# Patient Record
Sex: Female | Born: 1940 | Race: White | Hispanic: No | Smoking: Never smoker
Health system: Southern US, Community
[De-identification: ages and names within clinical notes are randomized; demographics above are authoritative.]

## PROBLEM LIST (undated history)

## (undated) DIAGNOSIS — S2249XA Multiple fractures of ribs, unspecified side, initial encounter for closed fracture: Secondary | ICD-10-CM

## (undated) DIAGNOSIS — M5090 Cervical disc disorder, unspecified, unspecified cervical region: Secondary | ICD-10-CM

## (undated) DIAGNOSIS — H0230 Blepharochalasis unspecified eye, unspecified eyelid: Secondary | ICD-10-CM

## (undated) DIAGNOSIS — Z9889 Other specified postprocedural states: Secondary | ICD-10-CM

## (undated) HISTORY — DX: Other specified postprocedural states: Z98.890

## (undated) HISTORY — DX: Cervical disc disorder, unspecified, unspecified cervical region: M50.90

## (undated) HISTORY — PX: KIDNEY STONE SURGERY: SHX686

## (undated) HISTORY — PX: OTHER SURGICAL HISTORY: SHX169

## (undated) HISTORY — DX: Blepharochalasis unspecified eye, unspecified eyelid: H02.30

## (undated) HISTORY — PX: TUBAL LIGATION: SHX77

## (undated) HISTORY — DX: Multiple fractures of ribs, unspecified side, initial encounter for closed fracture: S22.49XA

---

## 2000-09-22 ENCOUNTER — Other Ambulatory Visit: Admission: RE | Admit: 2000-09-22 | Discharge: 2000-09-22 | Payer: Self-pay | Admitting: Gynecology

## 2001-12-09 ENCOUNTER — Other Ambulatory Visit: Admission: RE | Admit: 2001-12-09 | Discharge: 2001-12-09 | Payer: Self-pay | Admitting: Gynecology

## 2003-07-01 HISTORY — PX: ROTATOR CUFF REPAIR: SHX139

## 2003-11-23 ENCOUNTER — Ambulatory Visit (HOSPITAL_BASED_OUTPATIENT_CLINIC_OR_DEPARTMENT_OTHER): Admission: RE | Admit: 2003-11-23 | Discharge: 2003-11-23 | Payer: Self-pay | Admitting: Orthopedic Surgery

## 2003-11-23 ENCOUNTER — Ambulatory Visit (HOSPITAL_COMMUNITY): Admission: RE | Admit: 2003-11-23 | Discharge: 2003-11-23 | Payer: Self-pay | Admitting: Orthopedic Surgery

## 2010-01-28 HISTORY — PX: BUNIONECTOMY: SHX129

## 2010-06-30 HISTORY — PX: CATARACT EXTRACTION: SUR2

## 2015-10-31 DIAGNOSIS — S0086XA Insect bite (nonvenomous) of other part of head, initial encounter: Secondary | ICD-10-CM | POA: Diagnosis not present

## 2015-10-31 DIAGNOSIS — L720 Epidermal cyst: Secondary | ICD-10-CM | POA: Diagnosis not present

## 2015-12-04 DIAGNOSIS — H524 Presbyopia: Secondary | ICD-10-CM | POA: Diagnosis not present

## 2015-12-04 DIAGNOSIS — H26493 Other secondary cataract, bilateral: Secondary | ICD-10-CM | POA: Diagnosis not present

## 2015-12-07 DIAGNOSIS — F419 Anxiety disorder, unspecified: Secondary | ICD-10-CM | POA: Diagnosis not present

## 2015-12-07 DIAGNOSIS — E7800 Pure hypercholesterolemia, unspecified: Secondary | ICD-10-CM | POA: Diagnosis not present

## 2015-12-13 DIAGNOSIS — L739 Follicular disorder, unspecified: Secondary | ICD-10-CM | POA: Diagnosis not present

## 2015-12-13 DIAGNOSIS — L570 Actinic keratosis: Secondary | ICD-10-CM | POA: Diagnosis not present

## 2015-12-13 DIAGNOSIS — L578 Other skin changes due to chronic exposure to nonionizing radiation: Secondary | ICD-10-CM | POA: Diagnosis not present

## 2015-12-13 DIAGNOSIS — D1801 Hemangioma of skin and subcutaneous tissue: Secondary | ICD-10-CM | POA: Diagnosis not present

## 2016-01-17 DIAGNOSIS — M50221 Other cervical disc displacement at C4-C5 level: Secondary | ICD-10-CM | POA: Diagnosis not present

## 2016-01-17 DIAGNOSIS — M25512 Pain in left shoulder: Secondary | ICD-10-CM | POA: Diagnosis not present

## 2016-01-17 DIAGNOSIS — R079 Chest pain, unspecified: Secondary | ICD-10-CM | POA: Diagnosis not present

## 2016-01-17 DIAGNOSIS — M542 Cervicalgia: Secondary | ICD-10-CM | POA: Diagnosis not present

## 2016-01-17 DIAGNOSIS — M50223 Other cervical disc displacement at C6-C7 level: Secondary | ICD-10-CM | POA: Diagnosis not present

## 2016-01-17 DIAGNOSIS — M50222 Other cervical disc displacement at C5-C6 level: Secondary | ICD-10-CM | POA: Diagnosis not present

## 2016-01-21 DIAGNOSIS — M542 Cervicalgia: Secondary | ICD-10-CM | POA: Diagnosis not present

## 2016-01-21 DIAGNOSIS — L720 Epidermal cyst: Secondary | ICD-10-CM | POA: Diagnosis not present

## 2016-02-26 DIAGNOSIS — M47812 Spondylosis without myelopathy or radiculopathy, cervical region: Secondary | ICD-10-CM | POA: Diagnosis not present

## 2016-02-26 DIAGNOSIS — M542 Cervicalgia: Secondary | ICD-10-CM | POA: Diagnosis not present

## 2016-04-16 DIAGNOSIS — Z23 Encounter for immunization: Secondary | ICD-10-CM | POA: Diagnosis not present

## 2016-04-16 DIAGNOSIS — N3000 Acute cystitis without hematuria: Secondary | ICD-10-CM | POA: Diagnosis not present

## 2016-07-24 DIAGNOSIS — M25551 Pain in right hip: Secondary | ICD-10-CM | POA: Diagnosis not present

## 2016-08-07 DIAGNOSIS — K644 Residual hemorrhoidal skin tags: Secondary | ICD-10-CM | POA: Diagnosis not present

## 2016-08-07 DIAGNOSIS — N952 Postmenopausal atrophic vaginitis: Secondary | ICD-10-CM | POA: Diagnosis not present

## 2016-10-30 DIAGNOSIS — N3000 Acute cystitis without hematuria: Secondary | ICD-10-CM | POA: Diagnosis not present

## 2016-10-30 DIAGNOSIS — N39498 Other specified urinary incontinence: Secondary | ICD-10-CM | POA: Diagnosis not present

## 2017-01-15 DIAGNOSIS — N3946 Mixed incontinence: Secondary | ICD-10-CM | POA: Diagnosis not present

## 2017-03-17 DIAGNOSIS — H2703 Aphakia, bilateral: Secondary | ICD-10-CM | POA: Diagnosis not present

## 2017-03-19 DIAGNOSIS — Z0000 Encounter for general adult medical examination without abnormal findings: Secondary | ICD-10-CM | POA: Diagnosis not present

## 2017-03-19 DIAGNOSIS — E7800 Pure hypercholesterolemia, unspecified: Secondary | ICD-10-CM | POA: Diagnosis not present

## 2017-03-19 DIAGNOSIS — Z79899 Other long term (current) drug therapy: Secondary | ICD-10-CM | POA: Diagnosis not present

## 2017-03-19 DIAGNOSIS — Z23 Encounter for immunization: Secondary | ICD-10-CM | POA: Diagnosis not present

## 2017-03-19 DIAGNOSIS — N39498 Other specified urinary incontinence: Secondary | ICD-10-CM | POA: Diagnosis not present

## 2017-03-19 DIAGNOSIS — Z6826 Body mass index (BMI) 26.0-26.9, adult: Secondary | ICD-10-CM | POA: Diagnosis not present

## 2017-04-08 DIAGNOSIS — D492 Neoplasm of unspecified behavior of bone, soft tissue, and skin: Secondary | ICD-10-CM | POA: Diagnosis not present

## 2017-04-16 DIAGNOSIS — S90862A Insect bite (nonvenomous), left foot, initial encounter: Secondary | ICD-10-CM | POA: Diagnosis not present

## 2017-04-16 DIAGNOSIS — L03032 Cellulitis of left toe: Secondary | ICD-10-CM | POA: Diagnosis not present

## 2017-04-27 DIAGNOSIS — D492 Neoplasm of unspecified behavior of bone, soft tissue, and skin: Secondary | ICD-10-CM | POA: Diagnosis not present

## 2017-04-27 DIAGNOSIS — M79672 Pain in left foot: Secondary | ICD-10-CM | POA: Diagnosis not present

## 2017-04-27 DIAGNOSIS — S62512A Displaced fracture of proximal phalanx of left thumb, initial encounter for closed fracture: Secondary | ICD-10-CM | POA: Diagnosis not present

## 2017-04-30 DIAGNOSIS — T63301A Toxic effect of unspecified spider venom, accidental (unintentional), initial encounter: Secondary | ICD-10-CM | POA: Insufficient documentation

## 2017-05-06 DIAGNOSIS — M79672 Pain in left foot: Secondary | ICD-10-CM | POA: Diagnosis not present

## 2017-05-06 DIAGNOSIS — S92502S Displaced unspecified fracture of left lesser toe(s), sequela: Secondary | ICD-10-CM | POA: Diagnosis not present

## 2017-05-07 DIAGNOSIS — S92502S Displaced unspecified fracture of left lesser toe(s), sequela: Secondary | ICD-10-CM | POA: Diagnosis not present

## 2017-05-18 DIAGNOSIS — M79672 Pain in left foot: Secondary | ICD-10-CM | POA: Diagnosis not present

## 2017-05-19 DIAGNOSIS — S92502S Displaced unspecified fracture of left lesser toe(s), sequela: Secondary | ICD-10-CM | POA: Diagnosis not present

## 2017-05-19 DIAGNOSIS — M79672 Pain in left foot: Secondary | ICD-10-CM | POA: Diagnosis not present

## 2017-05-19 DIAGNOSIS — M19072 Primary osteoarthritis, left ankle and foot: Secondary | ICD-10-CM | POA: Diagnosis not present

## 2017-05-19 DIAGNOSIS — X58XXXS Exposure to other specified factors, sequela: Secondary | ICD-10-CM | POA: Diagnosis not present

## 2017-05-20 DIAGNOSIS — R229 Localized swelling, mass and lump, unspecified: Secondary | ICD-10-CM | POA: Diagnosis not present

## 2017-05-20 DIAGNOSIS — R21 Rash and other nonspecific skin eruption: Secondary | ICD-10-CM | POA: Diagnosis not present

## 2017-05-20 DIAGNOSIS — L03115 Cellulitis of right lower limb: Secondary | ICD-10-CM | POA: Diagnosis not present

## 2017-05-20 DIAGNOSIS — T782XXA Anaphylactic shock, unspecified, initial encounter: Secondary | ICD-10-CM | POA: Diagnosis not present

## 2017-05-20 DIAGNOSIS — R079 Chest pain, unspecified: Secondary | ICD-10-CM | POA: Diagnosis not present

## 2017-05-25 DIAGNOSIS — T7840XA Allergy, unspecified, initial encounter: Secondary | ICD-10-CM | POA: Diagnosis not present

## 2017-05-25 DIAGNOSIS — L03119 Cellulitis of unspecified part of limb: Secondary | ICD-10-CM | POA: Diagnosis not present

## 2017-05-25 DIAGNOSIS — S92902D Unspecified fracture of left foot, subsequent encounter for fracture with routine healing: Secondary | ICD-10-CM | POA: Diagnosis not present

## 2017-06-03 DIAGNOSIS — M79672 Pain in left foot: Secondary | ICD-10-CM | POA: Diagnosis not present

## 2017-06-03 DIAGNOSIS — L03116 Cellulitis of left lower limb: Secondary | ICD-10-CM | POA: Diagnosis not present

## 2017-06-24 DIAGNOSIS — M79672 Pain in left foot: Secondary | ICD-10-CM | POA: Diagnosis not present

## 2017-07-23 DIAGNOSIS — M79672 Pain in left foot: Secondary | ICD-10-CM | POA: Diagnosis not present

## 2017-08-04 DIAGNOSIS — B351 Tinea unguium: Secondary | ICD-10-CM | POA: Diagnosis not present

## 2017-08-04 DIAGNOSIS — L0390 Cellulitis, unspecified: Secondary | ICD-10-CM | POA: Diagnosis not present

## 2017-09-11 DIAGNOSIS — M7061 Trochanteric bursitis, right hip: Secondary | ICD-10-CM | POA: Diagnosis not present

## 2017-09-30 DIAGNOSIS — R2689 Other abnormalities of gait and mobility: Secondary | ICD-10-CM | POA: Diagnosis not present

## 2017-09-30 DIAGNOSIS — M25551 Pain in right hip: Secondary | ICD-10-CM | POA: Diagnosis not present

## 2017-10-28 DIAGNOSIS — I8002 Phlebitis and thrombophlebitis of superficial vessels of left lower extremity: Secondary | ICD-10-CM | POA: Diagnosis not present

## 2017-10-29 DIAGNOSIS — M7061 Trochanteric bursitis, right hip: Secondary | ICD-10-CM | POA: Diagnosis not present

## 2017-10-29 DIAGNOSIS — Z6827 Body mass index (BMI) 27.0-27.9, adult: Secondary | ICD-10-CM | POA: Diagnosis not present

## 2017-11-13 DIAGNOSIS — M25551 Pain in right hip: Secondary | ICD-10-CM | POA: Diagnosis not present

## 2017-11-13 DIAGNOSIS — M7061 Trochanteric bursitis, right hip: Secondary | ICD-10-CM | POA: Diagnosis not present

## 2018-01-21 DIAGNOSIS — H0015 Chalazion left lower eyelid: Secondary | ICD-10-CM | POA: Diagnosis not present

## 2018-01-21 DIAGNOSIS — H01004 Unspecified blepharitis left upper eyelid: Secondary | ICD-10-CM | POA: Diagnosis not present

## 2018-02-15 DIAGNOSIS — M545 Low back pain: Secondary | ICD-10-CM | POA: Diagnosis not present

## 2018-02-15 DIAGNOSIS — M542 Cervicalgia: Secondary | ICD-10-CM | POA: Diagnosis not present

## 2018-02-15 DIAGNOSIS — M9903 Segmental and somatic dysfunction of lumbar region: Secondary | ICD-10-CM | POA: Diagnosis not present

## 2018-02-15 DIAGNOSIS — M9901 Segmental and somatic dysfunction of cervical region: Secondary | ICD-10-CM | POA: Diagnosis not present

## 2018-02-22 DIAGNOSIS — M9901 Segmental and somatic dysfunction of cervical region: Secondary | ICD-10-CM | POA: Diagnosis not present

## 2018-02-22 DIAGNOSIS — M545 Low back pain: Secondary | ICD-10-CM | POA: Diagnosis not present

## 2018-02-22 DIAGNOSIS — M9903 Segmental and somatic dysfunction of lumbar region: Secondary | ICD-10-CM | POA: Diagnosis not present

## 2018-02-22 DIAGNOSIS — M542 Cervicalgia: Secondary | ICD-10-CM | POA: Diagnosis not present

## 2018-02-23 DIAGNOSIS — H538 Other visual disturbances: Secondary | ICD-10-CM | POA: Diagnosis not present

## 2018-03-16 DIAGNOSIS — L570 Actinic keratosis: Secondary | ICD-10-CM | POA: Diagnosis not present

## 2018-03-16 DIAGNOSIS — L578 Other skin changes due to chronic exposure to nonionizing radiation: Secondary | ICD-10-CM | POA: Diagnosis not present

## 2018-03-22 DIAGNOSIS — H02054 Trichiasis without entropian left upper eyelid: Secondary | ICD-10-CM | POA: Diagnosis not present

## 2018-03-23 DIAGNOSIS — E663 Overweight: Secondary | ICD-10-CM | POA: Diagnosis not present

## 2018-03-23 DIAGNOSIS — K210 Gastro-esophageal reflux disease with esophagitis: Secondary | ICD-10-CM | POA: Diagnosis not present

## 2018-03-23 DIAGNOSIS — Z13820 Encounter for screening for osteoporosis: Secondary | ICD-10-CM | POA: Diagnosis not present

## 2018-03-23 DIAGNOSIS — E7800 Pure hypercholesterolemia, unspecified: Secondary | ICD-10-CM | POA: Diagnosis not present

## 2018-03-23 DIAGNOSIS — Z23 Encounter for immunization: Secondary | ICD-10-CM | POA: Diagnosis not present

## 2018-03-23 DIAGNOSIS — E559 Vitamin D deficiency, unspecified: Secondary | ICD-10-CM | POA: Diagnosis not present

## 2018-03-23 DIAGNOSIS — Z6827 Body mass index (BMI) 27.0-27.9, adult: Secondary | ICD-10-CM | POA: Diagnosis not present

## 2018-03-23 DIAGNOSIS — N959 Unspecified menopausal and perimenopausal disorder: Secondary | ICD-10-CM | POA: Diagnosis not present

## 2018-03-23 DIAGNOSIS — Z0000 Encounter for general adult medical examination without abnormal findings: Secondary | ICD-10-CM | POA: Diagnosis not present

## 2018-03-28 DIAGNOSIS — B029 Zoster without complications: Secondary | ICD-10-CM | POA: Diagnosis not present

## 2018-03-29 DIAGNOSIS — H53142 Visual discomfort, left eye: Secondary | ICD-10-CM | POA: Diagnosis not present

## 2018-03-30 DIAGNOSIS — G501 Atypical facial pain: Secondary | ICD-10-CM | POA: Diagnosis not present

## 2018-03-30 DIAGNOSIS — R51 Headache: Secondary | ICD-10-CM | POA: Diagnosis not present

## 2018-04-08 DIAGNOSIS — Z041 Encounter for examination and observation following transport accident: Secondary | ICD-10-CM | POA: Diagnosis not present

## 2018-04-08 DIAGNOSIS — M79662 Pain in left lower leg: Secondary | ICD-10-CM | POA: Diagnosis not present

## 2018-04-08 DIAGNOSIS — S2002XA Contusion of left breast, initial encounter: Secondary | ICD-10-CM | POA: Diagnosis not present

## 2018-04-08 DIAGNOSIS — Y92410 Unspecified street and highway as the place of occurrence of the external cause: Secondary | ICD-10-CM | POA: Diagnosis not present

## 2018-04-08 DIAGNOSIS — M79651 Pain in right thigh: Secondary | ICD-10-CM | POA: Diagnosis not present

## 2018-04-08 DIAGNOSIS — M542 Cervicalgia: Secondary | ICD-10-CM | POA: Diagnosis not present

## 2018-04-08 DIAGNOSIS — M79661 Pain in right lower leg: Secondary | ICD-10-CM | POA: Diagnosis not present

## 2018-04-08 DIAGNOSIS — S81812A Laceration without foreign body, left lower leg, initial encounter: Secondary | ICD-10-CM | POA: Diagnosis not present

## 2018-04-08 DIAGNOSIS — Z23 Encounter for immunization: Secondary | ICD-10-CM | POA: Diagnosis not present

## 2018-04-08 DIAGNOSIS — R109 Unspecified abdominal pain: Secondary | ICD-10-CM | POA: Diagnosis not present

## 2018-04-08 DIAGNOSIS — M545 Low back pain: Secondary | ICD-10-CM | POA: Diagnosis not present

## 2018-04-08 DIAGNOSIS — R51 Headache: Secondary | ICD-10-CM | POA: Diagnosis not present

## 2018-04-08 DIAGNOSIS — Y999 Unspecified external cause status: Secondary | ICD-10-CM | POA: Diagnosis not present

## 2018-04-08 DIAGNOSIS — S2242XA Multiple fractures of ribs, left side, initial encounter for closed fracture: Secondary | ICD-10-CM | POA: Diagnosis not present

## 2018-04-08 DIAGNOSIS — M25512 Pain in left shoulder: Secondary | ICD-10-CM | POA: Diagnosis not present

## 2018-04-08 DIAGNOSIS — S2769XA Other injury of pleura, initial encounter: Secondary | ICD-10-CM | POA: Diagnosis not present

## 2018-04-09 DIAGNOSIS — S20212A Contusion of left front wall of thorax, initial encounter: Secondary | ICD-10-CM | POA: Insufficient documentation

## 2018-04-09 DIAGNOSIS — R519 Headache, unspecified: Secondary | ICD-10-CM | POA: Insufficient documentation

## 2018-04-09 DIAGNOSIS — S81812A Laceration without foreign body, left lower leg, initial encounter: Secondary | ICD-10-CM | POA: Insufficient documentation

## 2018-04-09 DIAGNOSIS — M79651 Pain in right thigh: Secondary | ICD-10-CM | POA: Insufficient documentation

## 2018-04-09 DIAGNOSIS — R58 Hemorrhage, not elsewhere classified: Secondary | ICD-10-CM | POA: Insufficient documentation

## 2018-04-13 ENCOUNTER — Other Ambulatory Visit: Payer: Self-pay | Admitting: *Deleted

## 2018-04-13 DIAGNOSIS — S2242XD Multiple fractures of ribs, left side, subsequent encounter for fracture with routine healing: Secondary | ICD-10-CM | POA: Diagnosis not present

## 2018-04-13 DIAGNOSIS — S81812D Laceration without foreign body, left lower leg, subsequent encounter: Secondary | ICD-10-CM | POA: Diagnosis not present

## 2018-04-13 DIAGNOSIS — S20219D Contusion of unspecified front wall of thorax, subsequent encounter: Secondary | ICD-10-CM | POA: Diagnosis not present
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
## 2018-04-15 ENCOUNTER — Other Ambulatory Visit: Payer: Self-pay | Admitting: *Deleted
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
## 2018-04-20 ENCOUNTER — Ambulatory Visit: Payer: Self-pay | Admitting: *Deleted

## 2018-04-20 DIAGNOSIS — S20212D Contusion of left front wall of thorax, subsequent encounter: Secondary | ICD-10-CM | POA: Diagnosis not present

## 2018-04-20 DIAGNOSIS — S2242XA Multiple fractures of ribs, left side, initial encounter for closed fracture: Secondary | ICD-10-CM | POA: Diagnosis not present

## 2018-04-20 DIAGNOSIS — S20212A Contusion of left front wall of thorax, initial encounter: Secondary | ICD-10-CM | POA: Diagnosis not present

## 2018-04-20 DIAGNOSIS — S81812A Laceration without foreign body, left lower leg, initial encounter: Secondary | ICD-10-CM | POA: Diagnosis not present

## 2018-04-20 DIAGNOSIS — Z79899 Other long term (current) drug therapy: Secondary | ICD-10-CM | POA: Diagnosis not present

## 2018-04-20 DIAGNOSIS — M542 Cervicalgia: Secondary | ICD-10-CM | POA: Diagnosis not present

## 2018-04-20 DIAGNOSIS — S81812D Laceration without foreign body, left lower leg, subsequent encounter: Secondary | ICD-10-CM | POA: Diagnosis not present

## 2018-04-20 DIAGNOSIS — S2242XD Multiple fractures of ribs, left side, subsequent encounter for fracture with routine healing: Secondary | ICD-10-CM | POA: Diagnosis not present

## 2018-04-22 ENCOUNTER — Other Ambulatory Visit: Payer: Self-pay | Admitting: *Deleted
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
## 2018-04-26 DIAGNOSIS — S20219D Contusion of unspecified front wall of thorax, subsequent encounter: Secondary | ICD-10-CM | POA: Diagnosis not present

## 2018-04-26 DIAGNOSIS — S2242XD Multiple fractures of ribs, left side, subsequent encounter for fracture with routine healing: Secondary | ICD-10-CM | POA: Diagnosis not present

## 2018-04-26 DIAGNOSIS — S81812D Laceration without foreign body, left lower leg, subsequent encounter: Secondary | ICD-10-CM | POA: Diagnosis not present

## 2018-04-29 DIAGNOSIS — S81812D Laceration without foreign body, left lower leg, subsequent encounter: Secondary | ICD-10-CM | POA: Diagnosis not present

## 2018-04-29 DIAGNOSIS — T148XXA Other injury of unspecified body region, initial encounter: Secondary | ICD-10-CM | POA: Diagnosis not present

## 2018-04-29 DIAGNOSIS — S2242XD Multiple fractures of ribs, left side, subsequent encounter for fracture with routine healing: Secondary | ICD-10-CM | POA: Diagnosis not present

## 2018-05-03 DIAGNOSIS — S060X9A Concussion with loss of consciousness of unspecified duration, initial encounter: Secondary | ICD-10-CM | POA: Diagnosis not present

## 2018-05-03 DIAGNOSIS — S81812A Laceration without foreign body, left lower leg, initial encounter: Secondary | ICD-10-CM | POA: Diagnosis not present

## 2018-05-03 DIAGNOSIS — S2242XA Multiple fractures of ribs, left side, initial encounter for closed fracture: Secondary | ICD-10-CM | POA: Diagnosis not present

## 2018-05-12 DIAGNOSIS — S81812A Laceration without foreign body, left lower leg, initial encounter: Secondary | ICD-10-CM | POA: Diagnosis not present

## 2018-05-20 DIAGNOSIS — M542 Cervicalgia: Secondary | ICD-10-CM | POA: Diagnosis not present

## 2018-06-03 DIAGNOSIS — M542 Cervicalgia: Secondary | ICD-10-CM | POA: Diagnosis not present

## 2018-06-03 DIAGNOSIS — M79602 Pain in left arm: Secondary | ICD-10-CM | POA: Diagnosis not present

## 2018-06-03 DIAGNOSIS — M25512 Pain in left shoulder: Secondary | ICD-10-CM | POA: Diagnosis not present

## 2018-06-08 DIAGNOSIS — M25512 Pain in left shoulder: Secondary | ICD-10-CM | POA: Diagnosis not present

## 2018-06-08 DIAGNOSIS — M542 Cervicalgia: Secondary | ICD-10-CM | POA: Diagnosis not present

## 2018-06-08 DIAGNOSIS — M79602 Pain in left arm: Secondary | ICD-10-CM | POA: Diagnosis not present

## 2018-06-11 DIAGNOSIS — M542 Cervicalgia: Secondary | ICD-10-CM | POA: Diagnosis not present

## 2018-06-11 DIAGNOSIS — M79602 Pain in left arm: Secondary | ICD-10-CM | POA: Diagnosis not present

## 2018-06-11 DIAGNOSIS — M25512 Pain in left shoulder: Secondary | ICD-10-CM | POA: Diagnosis not present

## 2018-06-16 DIAGNOSIS — M542 Cervicalgia: Secondary | ICD-10-CM | POA: Diagnosis not present

## 2018-06-16 DIAGNOSIS — M79602 Pain in left arm: Secondary | ICD-10-CM | POA: Diagnosis not present

## 2018-06-16 DIAGNOSIS — M25512 Pain in left shoulder: Secondary | ICD-10-CM | POA: Diagnosis not present

## 2018-08-25 DIAGNOSIS — L821 Other seborrheic keratosis: Secondary | ICD-10-CM | POA: Diagnosis not present

## 2018-08-25 DIAGNOSIS — L820 Inflamed seborrheic keratosis: Secondary | ICD-10-CM | POA: Diagnosis not present

## 2018-08-25 DIAGNOSIS — L578 Other skin changes due to chronic exposure to nonionizing radiation: Secondary | ICD-10-CM | POA: Diagnosis not present

## 2018-08-25 DIAGNOSIS — L570 Actinic keratosis: Secondary | ICD-10-CM | POA: Diagnosis not present

## 2018-09-08 DIAGNOSIS — M79605 Pain in left leg: Secondary | ICD-10-CM | POA: Diagnosis not present

## 2018-10-05 DIAGNOSIS — D485 Neoplasm of uncertain behavior of skin: Secondary | ICD-10-CM | POA: Diagnosis not present

## 2018-10-25 DIAGNOSIS — H0015 Chalazion left lower eyelid: Secondary | ICD-10-CM | POA: Diagnosis not present

## 2018-10-28 ENCOUNTER — Other Ambulatory Visit: Payer: Self-pay | Admitting: *Deleted

## 2018-10-28 NOTE — Patient Outreach (Signed)
[facility]) Care Management  10/28/2018  Rebecca Pittman [birth year] [id number]   Late entry note for 04/26/18 case closure   Case closure   Call attempts made x 3 Unsuccessful outreach letter sent without a response   Plan closed case after no response from patient within 10 business days. Unable to reach  [name]. Lavina Hamman, RN, BSN, [place] Coordinator Office number [phone] Mobile number [phone]  Main [facility] number [phone] Fax number [phone]

## 2018-11-19 DIAGNOSIS — M79662 Pain in left lower leg: Secondary | ICD-10-CM | POA: Diagnosis not present

## 2018-11-19 DIAGNOSIS — R600 Localized edema: Secondary | ICD-10-CM | POA: Diagnosis not present

## 2018-11-20 DIAGNOSIS — L03116 Cellulitis of left lower limb: Secondary | ICD-10-CM | POA: Diagnosis not present

## 2018-11-20 DIAGNOSIS — M79662 Pain in left lower leg: Secondary | ICD-10-CM | POA: Diagnosis not present

## 2018-11-26 DIAGNOSIS — M7989 Other specified soft tissue disorders: Secondary | ICD-10-CM | POA: Diagnosis not present

## 2018-11-26 DIAGNOSIS — M25572 Pain in left ankle and joints of left foot: Secondary | ICD-10-CM | POA: Diagnosis not present

## 2018-11-26 DIAGNOSIS — G8929 Other chronic pain: Secondary | ICD-10-CM | POA: Diagnosis not present

## 2018-11-29 DIAGNOSIS — M25472 Effusion, left ankle: Secondary | ICD-10-CM | POA: Insufficient documentation

## 2018-12-03 DIAGNOSIS — L03116 Cellulitis of left lower limb: Secondary | ICD-10-CM | POA: Diagnosis not present

## 2018-12-07 DIAGNOSIS — L03116 Cellulitis of left lower limb: Secondary | ICD-10-CM | POA: Insufficient documentation

## 2018-12-24 DIAGNOSIS — M25572 Pain in left ankle and joints of left foot: Secondary | ICD-10-CM | POA: Diagnosis not present

## 2018-12-24 DIAGNOSIS — M25472 Effusion, left ankle: Secondary | ICD-10-CM | POA: Diagnosis not present

## 2019-01-06 DIAGNOSIS — M25572 Pain in left ankle and joints of left foot: Secondary | ICD-10-CM | POA: Diagnosis not present

## 2019-01-06 DIAGNOSIS — M7989 Other specified soft tissue disorders: Secondary | ICD-10-CM | POA: Diagnosis not present

## 2019-01-06 DIAGNOSIS — M25472 Effusion, left ankle: Secondary | ICD-10-CM | POA: Diagnosis not present

## 2019-01-12 DIAGNOSIS — M7989 Other specified soft tissue disorders: Secondary | ICD-10-CM | POA: Diagnosis not present

## 2019-01-12 DIAGNOSIS — S63509A Unspecified sprain of unspecified wrist, initial encounter: Secondary | ICD-10-CM | POA: Diagnosis not present

## 2019-01-13 DIAGNOSIS — M7989 Other specified soft tissue disorders: Secondary | ICD-10-CM | POA: Insufficient documentation

## 2019-01-13 DIAGNOSIS — S63509A Unspecified sprain of unspecified wrist, initial encounter: Secondary | ICD-10-CM | POA: Insufficient documentation

## 2019-02-07 DIAGNOSIS — M79605 Pain in left leg: Secondary | ICD-10-CM | POA: Diagnosis not present

## 2019-03-01 DIAGNOSIS — M7989 Other specified soft tissue disorders: Secondary | ICD-10-CM | POA: Diagnosis not present

## 2019-03-01 DIAGNOSIS — L03116 Cellulitis of left lower limb: Secondary | ICD-10-CM | POA: Diagnosis not present

## 2019-03-04 DIAGNOSIS — M79605 Pain in left leg: Secondary | ICD-10-CM | POA: Diagnosis not present

## 2019-03-21 DIAGNOSIS — Z6828 Body mass index (BMI) 28.0-28.9, adult: Secondary | ICD-10-CM | POA: Diagnosis not present

## 2019-03-21 DIAGNOSIS — E663 Overweight: Secondary | ICD-10-CM | POA: Diagnosis not present

## 2019-03-21 DIAGNOSIS — M79605 Pain in left leg: Secondary | ICD-10-CM | POA: Diagnosis not present

## 2019-03-28 DIAGNOSIS — E663 Overweight: Secondary | ICD-10-CM | POA: Diagnosis not present

## 2019-03-28 DIAGNOSIS — Z0000 Encounter for general adult medical examination without abnormal findings: Secondary | ICD-10-CM | POA: Diagnosis not present

## 2019-03-28 DIAGNOSIS — Z6828 Body mass index (BMI) 28.0-28.9, adult: Secondary | ICD-10-CM | POA: Diagnosis not present

## 2019-03-28 DIAGNOSIS — K210 Gastro-esophageal reflux disease with esophagitis: Secondary | ICD-10-CM | POA: Diagnosis not present

## 2019-03-28 DIAGNOSIS — E7800 Pure hypercholesterolemia, unspecified: Secondary | ICD-10-CM | POA: Diagnosis not present

## 2019-04-14 DIAGNOSIS — M8589 Other specified disorders of bone density and structure, multiple sites: Secondary | ICD-10-CM | POA: Diagnosis not present

## 2019-04-14 DIAGNOSIS — Z1231 Encounter for screening mammogram for malignant neoplasm of breast: Secondary | ICD-10-CM | POA: Diagnosis not present

## 2019-04-20 DIAGNOSIS — L03119 Cellulitis of unspecified part of limb: Secondary | ICD-10-CM | POA: Diagnosis not present

## 2019-05-03 DIAGNOSIS — M79605 Pain in left leg: Secondary | ICD-10-CM | POA: Diagnosis not present

## 2019-05-23 DIAGNOSIS — N6012 Diffuse cystic mastopathy of left breast: Secondary | ICD-10-CM | POA: Diagnosis not present

## 2019-05-23 DIAGNOSIS — R928 Other abnormal and inconclusive findings on diagnostic imaging of breast: Secondary | ICD-10-CM | POA: Diagnosis not present

## 2019-05-24 DIAGNOSIS — N3000 Acute cystitis without hematuria: Secondary | ICD-10-CM | POA: Diagnosis not present

## 2019-06-15 DIAGNOSIS — M79605 Pain in left leg: Secondary | ICD-10-CM | POA: Diagnosis not present

## 2019-07-07 DIAGNOSIS — L821 Other seborrheic keratosis: Secondary | ICD-10-CM | POA: Diagnosis not present

## 2019-07-07 DIAGNOSIS — L578 Other skin changes due to chronic exposure to nonionizing radiation: Secondary | ICD-10-CM | POA: Diagnosis not present

## 2019-07-14 DIAGNOSIS — R079 Chest pain, unspecified: Secondary | ICD-10-CM | POA: Diagnosis not present

## 2019-07-14 DIAGNOSIS — R5383 Other fatigue: Secondary | ICD-10-CM | POA: Diagnosis not present

## 2019-07-14 DIAGNOSIS — E649 Sequelae of unspecified nutritional deficiency: Secondary | ICD-10-CM | POA: Diagnosis not present

## 2019-07-14 DIAGNOSIS — E559 Vitamin D deficiency, unspecified: Secondary | ICD-10-CM | POA: Diagnosis not present

## 2019-07-14 DIAGNOSIS — R5381 Other malaise: Secondary | ICD-10-CM | POA: Diagnosis not present

## 2019-07-14 DIAGNOSIS — R4182 Altered mental status, unspecified: Secondary | ICD-10-CM | POA: Diagnosis not present

## 2019-07-14 DIAGNOSIS — Z6828 Body mass index (BMI) 28.0-28.9, adult: Secondary | ICD-10-CM | POA: Diagnosis not present

## 2019-07-14 DIAGNOSIS — L959 Vasculitis limited to the skin, unspecified: Secondary | ICD-10-CM | POA: Diagnosis not present

## 2019-07-26 DIAGNOSIS — L959 Vasculitis limited to the skin, unspecified: Secondary | ICD-10-CM | POA: Diagnosis not present
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
## 2019-08-03 ENCOUNTER — Encounter: Payer: Self-pay | Admitting: Cardiology

## 2019-08-03 ENCOUNTER — Ambulatory Visit (INDEPENDENT_AMBULATORY_CARE_PROVIDER_SITE_OTHER): Payer: Medicare HMO | Admitting: Cardiology

## 2019-08-03 ENCOUNTER — Other Ambulatory Visit: Payer: Self-pay

## 2019-08-03 VITALS — BP 110/74 | HR 89 | Ht 62.0 in | Wt 144.4 lb

## 2019-08-03 DIAGNOSIS — R079 Chest pain, unspecified: Secondary | ICD-10-CM

## 2019-08-03 DIAGNOSIS — E785 Hyperlipidemia, unspecified: Secondary | ICD-10-CM | POA: Diagnosis not present

## 2019-08-03 MED ORDER — PRAVASTATIN SODIUM 20 MG PO TABS: 20.0000 mg | ORAL_TABLET | Freq: Every evening | ORAL | 3 refills | Status: DC

## 2019-08-03 MED ORDER — METOPROLOL SUCCINATE ER 25 MG PO TB24: 25.0000 mg | ORAL_TABLET | Freq: Every day | ORAL | 4 refills | Status: DC

## 2019-08-03 MED ORDER — NITROGLYCERIN 0.4 MG SL SUBL: 0.4000 mg | SUBLINGUAL_TABLET | SUBLINGUAL | 3 refills | Status: DC | PRN

## 2019-08-03 MED ORDER — ASPIRIN EC 81 MG PO TBEC: 81.0000 mg | DELAYED_RELEASE_TABLET | Freq: Every day | ORAL | 3 refills | Status: DC
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
## 2019-08-03 NOTE — Patient Instructions (Addendum)
Medication Instructions:  Your physician has recommended you make the following change in your medication:   START taking aspirin 81 mg (1 tablet) once daily  START taking Toprol 25mg  (1 tablet) once daily  START taking nitroglycerin as needed for chest pain, When having chest pain, stop what you are doing and sit down. Take 1 nitro, wait 5 minutes. Still having chest pain, take 1 nitro, wait 5 minutes. Still having chest pain, take 1 nitro, dial 911. Total of 3 nitro in 15 minutes.  START taking pravastatin 20 mg (1 tablet) once daily  If you need a refill on your cardiac medications before your next appointment, please call your pharmacy.   Lab work: NONE If you have labs (blood work) drawn today and your tests are completely normal, you will receive your results only by: Marland Kitchen MyChart Message (if you have MyChart) OR . A paper copy in the mail If you have any lab test that is abnormal or we need to change your treatment, we will call you to review the results.  Testing/Procedures: You had an EKG performed today.  Your physician has requested that you have an echocardiogram. Echocardiography is a painless test that uses sound waves to create images of your heart. It provides your doctor with information about the size and shape of your heart and how well your heart's chambers and valves are working. This procedure takes approximately one hour. There are no restrictions for this procedure.  Your physician has requested that you have a lexiscan myoview. For further information please visit [email or web address]. Please follow instruction sheet, as given.    Follow-Up: At [facility], you and your health needs are our priority.  As part of our continuing mission to provide you with exceptional heart care, we have created designated Provider Care Teams.  These Care Teams include your primary Cardiologist (physician) and Advanced Practice Providers (APPs -  Physician Assistants and Nurse  Practitioners) who all work together to provide you with the care you need, when you need it. You will need a follow up appointment in 3 weeks  Any Other Special Instructions Will Be Listed Below  Regadenoson injection What is this medicine? REGADENOSON is used to test the heart for coronary artery disease. It is used in patients who can not exercise for their stress test. This medicine may be used for other purposes; ask your health care provider or pharmacist if you have questions. COMMON BRAND NAME(S): Lexiscan What should I tell my health care provider before I take this medicine? They need to know if you have any of these conditions:  heart problems  lung or breathing disease, like asthma or COPD  an unusual or allergic reaction to regadenoson, other medicines, foods, dyes, or preservatives  pregnant or trying to get pregnant  breast-feeding How should I use this medicine? This medicine is for injection into a vein. It is given by a health care professional in a hospital or clinic setting. Talk to your pediatrician regarding the use of this medicine in children. Special care may be needed. Overdosage: If you think you have taken too much of this medicine contact a poison control center or emergency room at once. NOTE: This medicine is only for you. Do not share this medicine with others. What if I miss a dose? This does not apply. What may interact with this medicine?  caffeine  dipyridamole  guarana  theophylline This list may not describe all possible interactions. Give your health care provider a list  of all the medicines, herbs, non-prescription drugs, or dietary supplements you use. Also tell them if you smoke, drink alcohol, or use illegal drugs. Some items may interact with your medicine. What should I watch for while using this medicine? Your condition will be monitored carefully while you are receiving this medicine. Do not take medicines, foods, or drinks with  caffeine (like coffee, tea, or colas) for at least 12 hours before your test. If you do not know if something contains caffeine, ask your health care professional. What side effects may I notice from receiving this medicine? Side effects that you should report to your doctor or health care professional as soon as possible:  allergic reactions like skin rash, itching or hives, swelling of the face, lips, or tongue  breathing problems  chest pain, tightness or palpitations  severe headache Side effects that usually do not require medical attention (report to your doctor or health care professional if they continue or are bothersome):  flushing  headache  irritation or pain at site where injected  nausea, vomiting This list may not describe all possible side effects. Call your doctor for medical advice about side effects. You may report side effects to FDA at 1-800-FDA-1088. Where should I keep my medicine? This drug is given in a hospital or clinic and will not be stored at home. NOTE: This sheet is a summary. It may not cover all possible information. If you have questions about this medicine, talk to your doctor, pharmacist, or health care provider.  2020 Elsevier/Gold Standard (2008-02-14 15:08:13)  Cardiac Nuclear Scan A cardiac nuclear scan is a test that is done to check the flow of blood to your heart. It is done when you are resting and when you are exercising. The test looks for problems such as:  Not enough blood reaching a portion of the heart.  The heart muscle not working as it should. You may need this test if:  You have heart disease.  You have had lab results that are not normal.  You have had heart surgery or a balloon procedure to open up blocked arteries (angioplasty).  You have chest pain.  You have shortness of breath. In this test, a special dye (tracer) is put into your bloodstream. The tracer will travel to your heart. A camera will then take pictures  of your heart to see how the tracer moves through your heart. This test is usually done at a hospital and takes 2-4 hours. Tell a doctor about:  Any allergies you have.  All medicines you are taking, including vitamins, herbs, eye drops, creams, and over-the-counter medicines.  Any problems you or family members have had with anesthetic medicines.  Any blood disorders you have.  Any surgeries you have had.  Any medical conditions you have.  Whether you are pregnant or may be pregnant. What are the risks? Generally, this is a safe test. However, problems may occur, such as:  Serious chest pain and heart attack. This is only a risk if the stress portion of the test is done.  Rapid heartbeat.  A feeling of warmth in your chest. This feeling usually does not last long.  Allergic reaction to the tracer. What happens before the test?  Ask your doctor about changing or stopping your normal medicines. This is important.  Follow instructions from your doctor about what you cannot eat or drink.  Remove your jewelry on the day of the test. What happens during the test?  An IV tube  will be inserted into one of your veins.  Your doctor will give you a small amount of tracer through the IV tube.  You will wait for 20-40 minutes while the tracer moves through your bloodstream.  Your heart will be monitored with an electrocardiogram (ECG).  You will lie down on an exam table.  Pictures of your heart will be taken for about 15-20 minutes.  You may also have a stress test. For this test, one of these things may be done: ? You will be asked to exercise on a treadmill or a stationary bike. ? You will be given medicines that will make your heart work harder. This is done if you are unable to exercise.  When blood flow to your heart has peaked, a tracer will again be given through the IV tube.  After 20-40 minutes, you will get back on the exam table. More pictures will be taken of  your heart.  Depending on the tracer that is used, more pictures may need to be taken 3-4 hours later.  Your IV tube will be removed when the test is over. The test may vary among doctors and hospitals. What happens after the test?  Ask your doctor: ? Whether you can return to your normal schedule, including diet, activities, and medicines. ? Whether you should drink more fluids. This will help to remove the tracer from your body. Drink enough fluid to keep your pee (urine) pale yellow.  Ask your doctor, or the department that is doing the test: ? When will my results be ready? ? How will I get my results? Summary  A cardiac nuclear scan is a test that is done to check the flow of blood to your heart.  Tell your doctor whether you are pregnant or may be pregnant.  Before the test, ask your doctor about changing or stopping your normal medicines. This is important.  Ask your doctor whether you can return to your normal activities. You may be asked to drink more fluids. This information is not intended to replace advice given to you by your health care provider. Make sure you discuss any questions you have with your health care provider. Document Released: 11/30/2017 Document Revised: 10/06/2018 Document Reviewed: 11/30/2017 Elsevier Patient Education  [employer].  Echocardiogram An echocardiogram is a procedure that uses painless sound waves (ultrasound) to produce an image of the heart. Images from an echocardiogram can provide important information about:  Signs of coronary artery disease (CAD).  Aneurysm detection. An aneurysm is a weak or damaged part of an artery wall that bulges out from the normal force of blood pumping through the body.  Heart size and shape. Changes in the size or shape of the heart can be associated with certain conditions, including heart failure, aneurysm, and CAD.  Heart muscle function.  Heart valve function.  Signs of a past heart  attack.  Fluid buildup around the heart.  Thickening of the heart muscle.  A tumor or infectious growth around the heart valves. Tell a health care provider about:  Any allergies you have.  All medicines you are taking, including vitamins, herbs, eye drops, creams, and over-the-counter medicines.  Any blood disorders you have.  Any surgeries you have had.  Any medical conditions you have.  Whether you are pregnant or may be pregnant. What are the risks? Generally, this is a safe procedure. However, problems may occur, including:  Allergic reaction to dye (contrast) that may be used during the procedure.  What happens before the procedure? No specific preparation is needed. You may eat and drink normally. What happens during the procedure?   An IV tube may be inserted into one of your veins.  You may receive contrast through this tube. A contrast is an injection that improves the quality of the pictures from your heart.  A gel will be applied to your chest.  A wand-like tool (transducer) will be moved over your chest. The gel will help to transmit the sound waves from the transducer.  The sound waves will harmlessly bounce off of your heart to allow the heart images to be captured in real-time motion. The images will be recorded on a computer. The procedure may vary among health care providers and hospitals. What happens after the procedure?  You may return to your normal, everyday life, including diet, activities, and medicines, unless your health care provider tells you not to do that. Summary  An echocardiogram is a procedure that uses painless sound waves (ultrasound) to produce an image of the heart.  Images from an echocardiogram can provide important information about the size and shape of your heart, heart muscle function, heart valve function, and fluid buildup around your heart.  You do not need to do anything to prepare before this procedure. You may eat and  drink normally.  After the echocardiogram is completed, you may return to your normal, everyday life, unless your health care provider tells you not to do that. This information is not intended to replace advice given to you by your health care provider. Make sure you discuss any questions you have with your health care provider. Document Released: 06/13/2000 Document Revised: 10/07/2018 Document Reviewed: 07/19/2016 Elsevier Patient Education  2020 [employer].

## 2019-08-05 DIAGNOSIS — M109 Gout, unspecified: Secondary | ICD-10-CM | POA: Diagnosis not present

## 2019-08-09 ENCOUNTER — Telehealth (HOSPITAL_COMMUNITY): Payer: Self-pay

## 2019-08-09 NOTE — Telephone Encounter (Signed)

## 2019-08-10 ENCOUNTER — Encounter: Payer: Self-pay | Admitting: Vascular Surgery

## 2019-08-10 ENCOUNTER — Telehealth (HOSPITAL_COMMUNITY): Payer: Self-pay | Admitting: *Deleted

## 2019-08-10 ENCOUNTER — Ambulatory Visit: Payer: Medicare HMO | Admitting: Vascular Surgery

## 2019-08-10 ENCOUNTER — Other Ambulatory Visit: Payer: Self-pay

## 2019-08-10 ENCOUNTER — Ambulatory Visit (HOSPITAL_COMMUNITY)
Admission: RE | Admit: 2019-08-10 | Discharge: 2019-08-10 | Disposition: A | Discharge status: Home / Self Care | Payer: Medicare HMO | Source: Ambulatory Visit | Attending: Surgery | Admitting: Surgery

## 2019-08-10 VITALS — BP 109/65 | HR 88 | Temp 97.5°F | Resp 18 | Ht 61.0 in | Wt 143.9 lb

## 2019-08-10 DIAGNOSIS — I739 Peripheral vascular disease, unspecified: Secondary | ICD-10-CM | POA: Insufficient documentation

## 2019-08-10 DIAGNOSIS — M79605 Pain in left leg: Secondary | ICD-10-CM

## 2019-08-10 NOTE — Telephone Encounter (Signed)
Patient given detailed instructions per Myocardial Perfusion Study Information Sheet for the test on 08/17/2019 at 0745. Patient notified to arrive 15 minutes early and that it is imperative to arrive on time for appointment to keep from having the test rescheduled.  If you need to cancel or reschedule your appointment, please call the office within 24 hours of your appointment. . Patient verbalized understanding.Kindell Strada, Ranae Palms No mychart available
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
## 2019-08-11 ENCOUNTER — Other Ambulatory Visit: Payer: Self-pay

## 2019-08-11 ENCOUNTER — Ambulatory Visit: Payer: Medicare HMO | Admitting: Infectious Diseases

## 2019-08-11 ENCOUNTER — Other Ambulatory Visit: Payer: Self-pay | Admitting: Infectious Diseases

## 2019-08-11 ENCOUNTER — Encounter: Payer: Self-pay | Admitting: Infectious Diseases

## 2019-08-11 VITALS — BP 133/83 | HR 73 | Wt 146.0 lb

## 2019-08-11 DIAGNOSIS — L03116 Cellulitis of left lower limb: Secondary | ICD-10-CM

## 2019-08-11 DIAGNOSIS — M86472 Chronic osteomyelitis with draining sinus, left ankle and foot: Secondary | ICD-10-CM | POA: Diagnosis not present

## 2019-08-11 NOTE — Addendum Note (Signed)
Addended by: Latrease Kunde C on: 08/11/2019 03:11 PM   Modules accepted: Orders
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
## 2019-08-11 NOTE — Assessment & Plan Note (Signed)
Will repeat her MRI.  Hold on restarting her anbx at this point.  I am concerned that there is underlying infection here- there is tenderness, erythema.  I would query that she has deep infection or atypical infection (mycobacteria?) with the persistence despite repeated courses of anbx.  Will see her back after MRI, I cautioned her she may need ortho eval for bx.   Gout would be another possibility however I would not suspect repeated episodes so frequently.

## 2019-08-12 ENCOUNTER — Telehealth: Payer: Self-pay

## 2019-08-12 NOTE — Telephone Encounter (Signed)
Received call today from [place] at [facility] regarding orders for MRI with Contrast. Judson Roch states that per pt reported breathing issues when she has mri done 2 years ago.  [facility] would like to know if Md would like for pt to have MRI done at hospital. Or if new order is needed. [phone] ext:[phone] [address] Rud, [place]

## 2019-08-15 ENCOUNTER — Telehealth: Payer: Self-pay

## 2019-08-15 ENCOUNTER — Telehealth: Payer: Self-pay | Admitting: Cardiology

## 2019-08-15 NOTE — Telephone Encounter (Signed)
New Message  Patient is calling in to check with Dr. Bettina Pittman about doing the stress test on 08/17/19. Patient states that she has been referred to a infectious disease doctor for possible infection in her foot. Patient would like to know if she should do the stress test or wait. Please call back to advise.

## 2019-08-15 NOTE — Telephone Encounter (Signed)
Patient called office today with concerns for Md. States cardiologist is recommending stress test,but patient is not sure if this would be a good idea. Is concerned that her infection will effect the test. Is also concerned about ankle pain and swelling. Would like to know if MD would suggest she keep current appt for stress test 2/18 or push back appointment.  [place]

## 2019-08-15 NOTE — Telephone Encounter (Signed)
Please do not delay stress test thanks

## 2019-08-15 NOTE — Telephone Encounter (Signed)
Please do at hospital.   I left VM on the radiology number listed.

## 2019-08-15 NOTE — Telephone Encounter (Signed)
Spoke with patient. Patient has been referred to infectious disease for her foot. Informed patient that should not affect her stress test as it is not a treadmill test. Patient verbalized understanding.

## 2019-08-16 NOTE — Telephone Encounter (Signed)
Patient called to make office aware that she has canceled her stress test after being advised not to do so.  Patient states she felt like she really needed her MRI prior to stress test.    Patient states she she is concerned with increased swelling going up her leg.  Then patient says she does not want to go to the hospital for something that has been ongoing for months.   Patient is currently awaiting prior auth for MRI.    Routing to [name] to make aware.

## 2019-08-17 ENCOUNTER — Other Ambulatory Visit (HOSPITAL_COMMUNITY): Payer: Medicare HMO

## 2019-08-17 ENCOUNTER — Ambulatory Visit (HOSPITAL_COMMUNITY): Payer: Medicare HMO

## 2019-08-17 ENCOUNTER — Other Ambulatory Visit: Payer: Self-pay | Admitting: Infectious Diseases

## 2019-08-17 NOTE — Addendum Note (Signed)
Addended by: Landis Gandy on: 08/17/2019 12:11 PM   Modules accepted: Orders

## 2019-08-22 ENCOUNTER — Emergency Department (HOSPITAL_COMMUNITY)
Admission: EM | Admit: 2019-08-22 | Discharge: 2019-08-22 | Disposition: A | Discharge status: Home / Self Care | Payer: Medicare HMO | Attending: Emergency Medicine | Admitting: Emergency Medicine

## 2019-08-22 ENCOUNTER — Emergency Department (HOSPITAL_COMMUNITY): Payer: Medicare HMO

## 2019-08-22 DIAGNOSIS — M25572 Pain in left ankle and joints of left foot: Secondary | ICD-10-CM | POA: Diagnosis present

## 2019-08-22 DIAGNOSIS — Z79899 Other long term (current) drug therapy: Secondary | ICD-10-CM | POA: Insufficient documentation

## 2019-08-22 DIAGNOSIS — S91002A Unspecified open wound, left ankle, initial encounter: Secondary | ICD-10-CM | POA: Diagnosis not present

## 2019-08-22 DIAGNOSIS — L539 Erythematous condition, unspecified: Secondary | ICD-10-CM | POA: Insufficient documentation

## 2019-08-22 DIAGNOSIS — L03116 Cellulitis of left lower limb: Secondary | ICD-10-CM | POA: Diagnosis not present

## 2019-08-22 DIAGNOSIS — Z4800 Encounter for change or removal of nonsurgical wound dressing: Secondary | ICD-10-CM | POA: Insufficient documentation

## 2019-08-22 DIAGNOSIS — S91302A Unspecified open wound, left foot, initial encounter: Secondary | ICD-10-CM | POA: Diagnosis not present

## 2019-08-22 LAB — CBC WITH DIFFERENTIAL/PLATELET
Abs Immature Granulocytes: 0.05 10*3/uL (ref 0.00–0.07)
Basophils Absolute: 0.1 10*3/uL (ref 0.0–0.1)
Basophils Relative: 1 %
Eosinophils Absolute: 0.1 10*3/uL (ref 0.0–0.5)
Eosinophils Relative: 2 %
HCT: 38.1 % (ref 36.0–46.0)
Hemoglobin: 12.5 g/dL (ref 12.0–15.0)
Immature Granulocytes: 1 %
Lymphocytes Relative: 21 %
Lymphs Abs: 1.4 10*3/uL (ref 0.7–4.0)
MCH: 31 pg (ref 26.0–34.0)
MCHC: 32.8 g/dL (ref 30.0–36.0)
MCV: 94.5 fL (ref 80.0–100.0)
Monocytes Absolute: 0.6 10*3/uL (ref 0.1–1.0)
Monocytes Relative: 10 %
Neutro Abs: 4.4 10*3/uL (ref 1.7–7.7)
Neutrophils Relative %: 65 %
Platelets: 285 10*3/uL (ref 150–400)
RBC: 4.03 MIL/uL (ref 3.87–5.11)
RDW: 12.3 % (ref 11.5–15.5)
WBC: 6.6 10*3/uL (ref 4.0–10.5)
nRBC: 0 % (ref 0.0–0.2)

## 2019-08-22 LAB — BASIC METABOLIC PANEL
Anion gap: 10 (ref 5–15)
BUN: 13 mg/dL (ref 8–23)
CO2: 27 mmol/L (ref 22–32)
Calcium: 8.9 mg/dL (ref 8.9–10.3)
Chloride: 105 mmol/L (ref 98–111)
Creatinine, Ser: 0.83 mg/dL (ref 0.44–1.00)
GFR calc Af Amer: >60 mL/min (ref >60)
GFR calc non Af Amer: >60 mL/min (ref >60)
Glucose, Bld: 97 mg/dL (ref 70–99)
Potassium: 4 mmol/L (ref 3.5–5.1)
Sodium: 142 mmol/L (ref 135–145)

## 2019-08-22 MED ORDER — CEPHALEXIN 500 MG PO CAPS: 500.0000 mg | ORAL_CAPSULE | Freq: Two times a day (BID) | ORAL | 0 refills | Status: DC

## 2019-08-22 MED ORDER — GADOBUTROL 1 MMOL/ML IV SOLN
7.0000 mL | Freq: Once | INTRAVENOUS | Status: AC | PRN
  Administered 2019-08-22: 7 mL via INTRAVENOUS

## 2019-08-22 NOTE — Progress Notes (Deleted)
Cardiology Office Note:    Date:  08/22/2019   ID:  Rebecca Pittman, DOB 20-Nov-1940, MRN [id number]  PCP:  Greig Right, MD  Cardiologist:  Shirlee More, MD    Referring MD: Greig Right, MD    ASSESSMENT:    No diagnosis found. PLAN:    In order of problems listed above:  1. ***   Next appointment: ***   Medication Adjustments/Labs and Tests Ordered: Current medicines are reviewed at length with the patient today.  Concerns regarding medicines are outlined above.  No orders of the defined types were placed in this encounter.  No orders of the defined types were placed in this encounter.   No chief complaint on file.   History of Present Illness:    Rebecca Pittman is a 79 y.o. female with a hx of chest pain last seen 08/02/2018. Compliance with diet, lifestyle and medications: *** Past Medical History:  Diagnosis Date  . Cervical disc disease   . H/O eye surgery   . H/O foot surgery   . Multiple rib fractures   . Pseudoptosis     Past Surgical History:  Procedure Laterality Date  . blepharoplasty upper 2013    . BUNIONECTOMY  01/2010  . CATARACT EXTRACTION Bilateral 2012  . KIDNEY STONE SURGERY    . ROTATOR CUFF REPAIR  2005  . TUBAL LIGATION      Current Medications: No outpatient medications have been marked as taking for the 08/23/19 encounter (Appointment) with Richardo Priest, MD.     Allergies:   Contrast media [iodinated diagnostic agents], Diphenhydramine hcl, Other, and Sulfamethoxazole-trimethoprim   Social History   Socioeconomic History  . Marital status: Widowed    Spouse name: Not on file  . Number of children: Not on file  . Years of education: Not on file  . Highest education level: Not on file  Occupational History  . Not on file  Tobacco Use  . Smoking status: Never Smoker  . Smokeless tobacco: Never Used  Substance and Sexual Activity  . Alcohol use: Never  . Drug use: Never  . Sexual activity: Not Currently  Other  Topics Concern  . Not on file  Social History Narrative  . Not on file   Social Determinants of Health   Financial Resource Strain:   . Difficulty of Paying Living Expenses: Not on file  Food Insecurity:   . Worried About Charity fundraiser in the Last Year: Not on file  . Ran Out of Food in the Last Year: Not on file  Transportation Needs:   . Lack of Transportation (Medical): Not on file  . Lack of Transportation (Non-Medical): Not on file  Physical Activity:   . Days of Exercise per Week: Not on file  . Minutes of Exercise per Session: Not on file  Stress:   . Feeling of Stress : Not on file  Social Connections:   . Frequency of Communication with Friends and Family: Not on file  . Frequency of Social Gatherings with Friends and Family: Not on file  . Attends Religious Services: Not on file  . Active Member of Clubs or Organizations: Not on file  . Attends Archivist Meetings: Not on file  . Marital Status: Not on file     Family History: The patient's ***family history includes Atrial fibrillation in her sister; Colon cancer in her mother; Hyperlipidemia in her brother; Hypertension in her brother, father, and sister. ROS:   Please see  the history of present illness.    All other systems reviewed and are negative.  EKGs/Labs/Other Studies Reviewed:    The following studies were reviewed today:  EKG:  EKG ordered today and personally reviewed.  The ekg ordered today demonstrates ***  Recent Labs: 08/22/2019: BUN 13; Creatinine, Ser 0.83; Hemoglobin 12.5; Platelets 285; Potassium 4.0; Sodium 142  Recent Lipid Panel No results found for: CHOL, TRIG, HDL, CHOLHDL, VLDL, LDLCALC, LDLDIRECT  Physical Exam:    VS:  LMP  (LMP Unknown)     Wt Readings from Last 3 Encounters:  08/22/19 146 lb (66.2 kg)  08/11/19 146 lb (66.2 kg)  08/10/19 143 lb 14.4 oz (65.3 kg)     GEN: *** Well nourished, well developed in no acute distress HEENT: Normal NECK: No JVD;  No carotid bruits LYMPHATICS: No lymphadenopathy CARDIAC: ***RRR, no murmurs, rubs, gallops RESPIRATORY:  Clear to auscultation without rales, wheezing or rhonchi  ABDOMEN: Soft, non-tender, non-distended MUSCULOSKELETAL:  No edema; No deformity  SKIN: Warm and dry NEUROLOGIC:  Alert and oriented x 3 PSYCHIATRIC:  Normal affect    Signed, Shirlee More, MD  08/22/2019 1:51 PM    [facility]

## 2019-08-22 NOTE — ED Triage Notes (Signed)
Pt reports left ankle nonhealing wound for the last year. Pt reports wound worsened Saturday night, area with reddness, warmth. Saw [name] who considered underlying infection. Denies recent fevers. Pt ambulatory. NAD at present.

## 2019-08-22 NOTE — Discharge Instructions (Addendum)
The MRI of your left ankle showed cellulitis. I have spoken to [name] who recommended we start antibiotics for you today.  I have written a prescription for keflex, please take 1 tablet twice a day for the next 7 days. Follow up with him in office.

## 2019-08-22 NOTE — ED Notes (Signed)
Patient verbalizes understanding of discharge instructions. Opportunity for questioning and answers were provided. Armband removed by staff, pt discharged from ED.  
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
## 2019-08-23 ENCOUNTER — Ambulatory Visit: Payer: Medicare HMO | Admitting: Cardiology

## 2019-08-23 NOTE — Telephone Encounter (Signed)
No message needed °

## 2019-08-25 ENCOUNTER — Ambulatory Visit: Payer: Medicare HMO | Admitting: Vascular Surgery

## 2019-08-29 ENCOUNTER — Telehealth: Payer: Self-pay

## 2019-08-29 NOTE — Telephone Encounter (Signed)
COVID-19 Pre-Screening Questions:08/29/19   Do you currently have a fever (>100 F), chills or unexplained body aches? NO   Are you currently experiencing new cough, shortness of breath, sore throat, runny nose?NO  .  Have you recently travelled outside the state of [place] in the last 14 days? NO  .  Have you been in contact with someone that is currently pending confirmation of Covid19 testing or has been confirmed to have the Covid19 virus?  NO  **If the patient answers NO to ALL questions -  advise the patient to please call the clinic before coming to the office should any symptoms develop.     

## 2019-08-30 ENCOUNTER — Encounter: Payer: Self-pay | Admitting: Infectious Diseases

## 2019-08-30 ENCOUNTER — Ambulatory Visit: Payer: Medicare HMO | Admitting: Infectious Diseases

## 2019-08-30 ENCOUNTER — Other Ambulatory Visit: Payer: Self-pay

## 2019-08-30 DIAGNOSIS — L03116 Cellulitis of left lower limb: Secondary | ICD-10-CM | POA: Diagnosis not present

## 2019-08-30 MED ORDER — CEPHALEXIN 500 MG PO CAPS: 500.0000 mg | ORAL_CAPSULE | Freq: Two times a day (BID) | ORAL | 0 refills | Status: AC

## 2019-08-30 NOTE — Assessment & Plan Note (Addendum)
Her MRI was unrevealing for deep infection.  Will give her oritavancin, see her in 2 weeks.  May need skin bx? Tissue Cx.? We discussed this plan and she in concurrence.
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
## 2019-08-31 ENCOUNTER — Telehealth: Payer: Self-pay | Admitting: *Deleted

## 2019-08-31 NOTE — Telephone Encounter (Signed)
Stop keflex after she gets infusion Thanks!

## 2019-08-31 NOTE — Telephone Encounter (Signed)
Infusion scheduled for 3/9 at 11 with [facility]. Written orders faxed to [place]. Left voicemail on patients home phone with appointment information and clarification on on IV medication. Requested patient call office back with any questions. Rebecca Pittman

## 2019-08-31 NOTE — Telephone Encounter (Signed)
Patient called with some concerns about the IV infusion after looking it up last night. RN was able to answer many of her questions, reassure her that we are here at any time should she have additional concerns.  She would like to proceed with the infusion as planned. She would specifically like to know about the timing of oral antibiotics/iv infusion. Is this to be scheduled once she completes the cephalexin, or will she continue (or stop) the keflex once the infusion is scheduled? Please advise. Landis Gandy, RN

## 2019-09-01 ENCOUNTER — Telehealth (HOSPITAL_COMMUNITY): Payer: Self-pay | Admitting: Cardiology

## 2019-09-01 NOTE — Telephone Encounter (Signed)
I called patient to schedule Echocardiogram and patient declined to schedule to issues with her foot.

## 2019-09-02 NOTE — Telephone Encounter (Signed)
x2 attempt to call patient regarding appointment for infusion. Patient did not pick up call on either number listed.  [place]

## 2019-09-02 NOTE — Telephone Encounter (Signed)
Spoke with patient, confirmed infusion appointment details. Landis Gandy, RN

## 2019-09-05 ENCOUNTER — Other Ambulatory Visit (HOSPITAL_COMMUNITY): Payer: Self-pay | Admitting: *Deleted

## 2019-09-06 ENCOUNTER — Other Ambulatory Visit: Payer: Self-pay

## 2019-09-06 ENCOUNTER — Ambulatory Visit (HOSPITAL_COMMUNITY)
Admission: RE | Admit: 2019-09-06 | Discharge: 2019-09-06 | Disposition: A | Discharge status: Home / Self Care | Payer: Medicare HMO | Source: Ambulatory Visit | Attending: Infectious Diseases | Admitting: Infectious Diseases

## 2019-09-06 DIAGNOSIS — L03116 Cellulitis of left lower limb: Secondary | ICD-10-CM | POA: Insufficient documentation

## 2019-09-06 MED ORDER — ORITAVANCIN DIPHOSPHATE 400 MG IV SOLR
1200.0000 mg | Freq: Once | INTRAVENOUS | Status: AC
  Administered 2019-09-06: 11:00:00 1200 mg via INTRAVENOUS
  Filled 2019-09-06: qty 120

## 2019-09-06 NOTE — Discharge Instructions (Signed)
Oritavancin injection °What is this medicine? °ORITAVANCIN (ORE ita van sin) is an antibiotic. It is used to treat certain kinds of bacterial infections. It will not work for colds, flu, or other viral infections. °This medicine may be used for other purposes; ask your health care provider or pharmacist if you have questions. °COMMON BRAND NAME(S): ORBACTIV °What should I tell my health care provider before I take this medicine? °They need to know if you have any of these conditions: °· intestine problems, like colitis °· take medicines that treat or prevent blood clots °· an unusual or allergic reaction to oritavancin, other medicines, foods, dyes, or preservatives °· pregnant or trying to get pregnant °· breast-feeding °How should I use this medicine? °This medicine is for infusion into a vein. It is usually given by a health care professional in a hospital or clinic setting. °If you get this medicine at home, you will be taught how to prepare and give this medicine. Use exactly as directed. Take your medicine at regular intervals. Do not take your medicine more often than directed. °It is important that you put your used needles and syringes in a special sharps container. Do not put them in a trash can. If you do not have a sharps container, call your pharmacist or healthcare provider to get one. °Talk to your pediatrician regarding the use of this medicine in children. Special care may be needed. °Overdosage: If you think you have taken too much of this medicine contact a poison control center or emergency room at once. °NOTE: This medicine is only for you. Do not share this medicine with others. °What if I miss a dose? °It is important not to miss your dose. Call your doctor or health care professional if you are unable to keep an appointment. If you give yourself the medicine and you miss a dose, take it as soon as you can. If it is almost time for your next dose, take only that dose. Do not take double or  extra doses. °What may interact with this medicine? °Do not take this medicine with any of the following medications: °· heparin (infused into a vein) °This medicine may also interact with the following medications: °· birth control pills °· dextromethorphan °· midazolam °· omeprazole °· warfarin °This list may not describe all possible interactions. Give your health care provider a list of all the medicines, herbs, non-prescription drugs, or dietary supplements you use. Also tell them if you smoke, drink alcohol, or use illegal drugs. Some items may interact with your medicine. °What should I watch for while using this medicine? °Tell your doctor or healthcare professional if your symptoms do not start to get better or if they get worse. °Do not treat diarrhea with over the counter products. Contact your doctor if you have diarrhea that lasts more than 2 days or if it is severe and watery. °What side effects may I notice from receiving this medicine? °Side effects that you should report to your doctor or health care professional as soon as possible: °· allergic reactions like skin rash, itching or hives, swelling of the face, lips, or tongue °· bone pain °· bloody or watery diarrhea °· fast, irregular heartbeat °Side effects that usually do not require medical attention (report to your doctor or health care professional if they continue or are bothersome): °· diarrhea °· dizziness °· headache °· nausea, vomiting °· pain, redness, or irritation at site where injected °This list may not describe all possible side effects.   Call your doctor for medical advice about side effects. You may report side effects to FDA at 1-800-FDA-1088. °Where should I keep my medicine? °Keep out of the reach of children. °This drug is usually given in a hospital or clinic and will not be stored at home. °In rare cases, this medicine may be given at home. If you are using this medicine at home, you will be instructed on how to store this  medicine. Throw away any unused medicine after the expiration date on the label. °NOTE: This sheet is a summary. It may not cover all possible information. If you have questions about this medicine, talk to your doctor, pharmacist, or health care provider. °© 2020 Elsevier/Gold Standard (2015-07-19 11:47:32) ° °

## 2019-09-13 ENCOUNTER — Ambulatory Visit (INDEPENDENT_AMBULATORY_CARE_PROVIDER_SITE_OTHER): Payer: Medicare HMO | Admitting: Infectious Diseases

## 2019-09-13 ENCOUNTER — Encounter: Payer: Self-pay | Admitting: Infectious Diseases

## 2019-09-13 ENCOUNTER — Other Ambulatory Visit: Payer: Self-pay

## 2019-09-13 DIAGNOSIS — L03116 Cellulitis of left lower limb: Secondary | ICD-10-CM | POA: Diagnosis not present

## 2019-09-13 MED ORDER — DAPTOMYCIN IV (FOR PTA / DISCHARGE USE ONLY): 270.0000 mg | INTRAVENOUS | 0 refills | Status: DC

## 2019-09-13 NOTE — Assessment & Plan Note (Addendum)
Will start her on dapto/ceftriaxone Will have her seen by Derm (vs rheum) Have asked her to stop scrubbing her ankle.  Will see her back in 2 weeks.  She is hesitant to get repeat MRI ("3 in a year? This has been going on since feb 2020).  No gardening, no pets. Went to [employer] in July (this started prior).
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
## 2019-09-14 ENCOUNTER — Other Ambulatory Visit: Payer: Self-pay

## 2019-09-14 ENCOUNTER — Other Ambulatory Visit: Payer: Self-pay | Admitting: Infectious Diseases

## 2019-09-14 ENCOUNTER — Telehealth: Payer: Self-pay

## 2019-09-14 ENCOUNTER — Telehealth (HOSPITAL_COMMUNITY): Payer: Self-pay

## 2019-09-14 DIAGNOSIS — L03116 Cellulitis of left lower limb: Secondary | ICD-10-CM

## 2019-09-14 LAB — COMPREHENSIVE METABOLIC PANEL
AG Ratio: 1.5 (calc) (ref 1.0–2.5)
ALT: 11 U/L (ref 6–29)
AST: 16 U/L (ref 10–35)
Albumin: 3.6 g/dL (ref 3.6–5.1)
Alkaline phosphatase (APISO): 79 U/L (ref 37–153)
BUN: 13 mg/dL (ref 7–25)
CO2: 28 mmol/L (ref 20–32)
Calcium: 8.8 mg/dL (ref 8.6–10.4)
Chloride: 107 mmol/L (ref 98–110)
Creat: 0.81 mg/dL (ref 0.60–0.93)
Globulin: 2.4 g/dL (calc) (ref 1.9–3.7)
Glucose, Bld: 101 mg/dL — ABNORMAL HIGH (ref 65–99)
Potassium: 4.4 mmol/L (ref 3.5–5.3)
Sodium: 142 mmol/L (ref 135–146)
Total Bilirubin: 0.4 mg/dL (ref 0.2–1.2)
Total Protein: 6 g/dL — ABNORMAL LOW (ref 6.1–8.1)

## 2019-09-14 LAB — CBC
HCT: 34.9 % — ABNORMAL LOW (ref 35.0–45.0)
Hemoglobin: 11.9 g/dL (ref 11.7–15.5)
MCH: 31.3 pg (ref 27.0–33.0)
MCHC: 34.1 g/dL (ref 32.0–36.0)
MCV: 91.8 fL (ref 80.0–100.0)
MPV: 10 fL (ref 7.5–12.5)
Platelets: 268 10*3/uL (ref 140–400)
RBC: 3.8 10*6/uL (ref 3.80–5.10)
RDW: 12.2 % (ref 11.0–15.0)
WBC: 8.6 10*3/uL (ref 3.8–10.8)

## 2019-09-14 LAB — URIC ACID: Uric Acid, Serum: 4.9 mg/dL (ref 2.5–7.0)

## 2019-09-14 LAB — CK: Total CK: 78 U/L (ref 29–143)

## 2019-09-14 MED ORDER — DOXYCYCLINE HYCLATE 100 MG PO TABS: 100.0000 mg | ORAL_TABLET | Freq: Two times a day (BID) | ORAL | 0 refills | Status: DC

## 2019-09-14 MED ORDER — LEVOFLOXACIN 500 MG PO TABS: 500.0000 mg | ORAL_TABLET | Freq: Every day | ORAL | 0 refills | Status: DC

## 2019-09-14 NOTE — Telephone Encounter (Signed)
Called patient to inform her that [name] sent in two oral antibiotics for her to try, doxycycline and levofloxacin. Reviewed administration instructions and informed her that we would cancel her PICC line appointment. Patient will call with any questions.  Rayann Jolley Lorita Officer, RN

## 2019-09-14 NOTE — Telephone Encounter (Signed)
Called patient to inform her of PICC placement date + time. After appointment was made saw that she refused to schedule with IR previously. Left voicemail telling patient to call office with any questions or concerns.   Reviewed PICC lines extensively with patient yesterday after her visit. Patient verbalized understanding and didn't have any questions after.   Emilia Kayes Lorita Officer, RN

## 2019-09-14 NOTE — Telephone Encounter (Signed)
Called to schedule picc placement. Pt does not want to schedule at this time. She would like to go an alternate route. She will call Dr. Algis Downs office and discuss with them. AW

## 2019-09-14 NOTE — Telephone Encounter (Signed)
Rx's sent for her for doxy and levaquin.  thanks

## 2019-09-14 NOTE — Telephone Encounter (Signed)
Patient left voicemail on triage line stating she would like to hold off on picc line. States she feels like this is too invasive for her. Would like to know if we can offer oral antibiotics to treat infection. Will forward message to MD to advise on concerns. Spoke with Caryl Pina at [facility] to cancel picc line appt. Will call patient later today to follow up on message. [place]

## 2019-09-16 ENCOUNTER — Other Ambulatory Visit: Payer: Self-pay

## 2019-09-16 ENCOUNTER — Telehealth: Payer: Self-pay | Admitting: *Deleted

## 2019-09-16 ENCOUNTER — Inpatient Hospital Stay (HOSPITAL_COMMUNITY)
Admission: AD | Admit: 2019-09-16 | Discharge: 2019-09-29 | DRG: 982 | Disposition: A | Discharge status: Skilled Nursing Facility | Payer: Medicare HMO | Attending: Internal Medicine | Admitting: Internal Medicine

## 2019-09-16 ENCOUNTER — Ambulatory Visit (HOSPITAL_COMMUNITY): Payer: Medicare HMO

## 2019-09-16 ENCOUNTER — Encounter (HOSPITAL_COMMUNITY): Payer: Self-pay | Admitting: Internal Medicine

## 2019-09-16 ENCOUNTER — Inpatient Hospital Stay (HOSPITAL_COMMUNITY): Payer: Medicare HMO

## 2019-09-16 ENCOUNTER — Ambulatory Visit: Payer: Medicare HMO | Admitting: Infectious Diseases

## 2019-09-16 ENCOUNTER — Encounter: Payer: Self-pay | Admitting: Infectious Diseases

## 2019-09-16 DIAGNOSIS — Z9841 Cataract extraction status, right eye: Secondary | ICD-10-CM | POA: Diagnosis not present

## 2019-09-16 DIAGNOSIS — R269 Unspecified abnormalities of gait and mobility: Secondary | ICD-10-CM | POA: Diagnosis not present

## 2019-09-16 DIAGNOSIS — R0902 Hypoxemia: Secondary | ICD-10-CM | POA: Diagnosis not present

## 2019-09-16 DIAGNOSIS — M79662 Pain in left lower leg: Secondary | ICD-10-CM | POA: Diagnosis not present

## 2019-09-16 DIAGNOSIS — Z9842 Cataract extraction status, left eye: Secondary | ICD-10-CM

## 2019-09-16 DIAGNOSIS — Z79899 Other long term (current) drug therapy: Secondary | ICD-10-CM | POA: Diagnosis not present

## 2019-09-16 DIAGNOSIS — Z800 Family history of malignant neoplasm of digestive organs: Secondary | ICD-10-CM

## 2019-09-16 DIAGNOSIS — Z7982 Long term (current) use of aspirin: Secondary | ICD-10-CM | POA: Diagnosis not present

## 2019-09-16 DIAGNOSIS — M8629 Subacute osteomyelitis, multiple sites: Secondary | ICD-10-CM | POA: Diagnosis not present

## 2019-09-16 DIAGNOSIS — G8929 Other chronic pain: Secondary | ICD-10-CM | POA: Diagnosis present

## 2019-09-16 DIAGNOSIS — L03112 Cellulitis of left axilla: Secondary | ICD-10-CM | POA: Diagnosis not present

## 2019-09-16 DIAGNOSIS — M25572 Pain in left ankle and joints of left foot: Secondary | ICD-10-CM | POA: Diagnosis present

## 2019-09-16 DIAGNOSIS — M25472 Effusion, left ankle: Secondary | ICD-10-CM | POA: Diagnosis not present

## 2019-09-16 DIAGNOSIS — I10 Essential (primary) hypertension: Secondary | ICD-10-CM | POA: Diagnosis not present

## 2019-09-16 DIAGNOSIS — Z87828 Personal history of other (healed) physical injury and trauma: Secondary | ICD-10-CM | POA: Diagnosis not present

## 2019-09-16 DIAGNOSIS — L03116 Cellulitis of left lower limb: Principal | ICD-10-CM

## 2019-09-16 DIAGNOSIS — M2550 Pain in unspecified joint: Secondary | ICD-10-CM | POA: Diagnosis not present

## 2019-09-16 DIAGNOSIS — E785 Hyperlipidemia, unspecified: Secondary | ICD-10-CM | POA: Diagnosis not present

## 2019-09-16 DIAGNOSIS — L539 Erythematous condition, unspecified: Secondary | ICD-10-CM | POA: Diagnosis not present

## 2019-09-16 DIAGNOSIS — M79609 Pain in unspecified limb: Secondary | ICD-10-CM | POA: Diagnosis not present

## 2019-09-16 DIAGNOSIS — M86272 Subacute osteomyelitis, left ankle and foot: Secondary | ICD-10-CM | POA: Diagnosis not present

## 2019-09-16 DIAGNOSIS — Z87442 Personal history of urinary calculi: Secondary | ICD-10-CM

## 2019-09-16 DIAGNOSIS — Z881 Allergy status to other antibiotic agents status: Secondary | ICD-10-CM | POA: Diagnosis not present

## 2019-09-16 DIAGNOSIS — Z8349 Family history of other endocrine, nutritional and metabolic diseases: Secondary | ICD-10-CM

## 2019-09-16 DIAGNOSIS — Z20822 Contact with and (suspected) exposure to covid-19: Secondary | ICD-10-CM | POA: Diagnosis present

## 2019-09-16 DIAGNOSIS — Z8249 Family history of ischemic heart disease and other diseases of the circulatory system: Secondary | ICD-10-CM | POA: Diagnosis not present

## 2019-09-16 DIAGNOSIS — S99912S Unspecified injury of left ankle, sequela: Secondary | ICD-10-CM

## 2019-09-16 DIAGNOSIS — R600 Localized edema: Secondary | ICD-10-CM | POA: Diagnosis not present

## 2019-09-16 DIAGNOSIS — Z888 Allergy status to other drugs, medicaments and biological substances status: Secondary | ICD-10-CM | POA: Diagnosis not present

## 2019-09-16 DIAGNOSIS — L02416 Cutaneous abscess of left lower limb: Secondary | ICD-10-CM | POA: Diagnosis not present

## 2019-09-16 DIAGNOSIS — Z7401 Bed confinement status: Secondary | ICD-10-CM | POA: Diagnosis not present

## 2019-09-16 MED ORDER — ONDANSETRON HCL 4 MG PO TABS: 4.0000 mg | ORAL_TABLET | Freq: Four times a day (QID) | ORAL | Status: DC | PRN

## 2019-09-16 MED ORDER — SODIUM CHLORIDE 0.9 % IV SOLN
2.0000 g | Freq: Two times a day (BID) | INTRAVENOUS | Status: DC
  Administered 2019-09-16 – 2019-09-20 (×8): 2 g via INTRAVENOUS
  Filled 2019-09-16 (×8): qty 2

## 2019-09-16 MED ORDER — ACETAMINOPHEN 650 MG RE SUPP: 650.0000 mg | Freq: Four times a day (QID) | RECTAL | Status: DC | PRN

## 2019-09-16 MED ORDER — ONDANSETRON HCL 4 MG/2ML IJ SOLN
4.0000 mg | Freq: Four times a day (QID) | INTRAMUSCULAR | Status: DC | PRN
  Administered 2019-09-21: 4 mg via INTRAVENOUS
  Filled 2019-09-16: qty 2

## 2019-09-16 MED ORDER — ENOXAPARIN SODIUM 40 MG/0.4ML ~~LOC~~ SOLN
40.0000 mg | SUBCUTANEOUS | Status: DC
  Administered 2019-09-16 – 2019-09-28 (×13): 40 mg via SUBCUTANEOUS
  Filled 2019-09-16 (×13): qty 0.4

## 2019-09-16 MED ORDER — VITAMIN D 25 MCG (1000 UNIT) PO TABS
1000.0000 [IU] | ORAL_TABLET | Freq: Every day | ORAL | Status: DC
  Administered 2019-09-16 – 2019-09-29 (×12): 1000 [IU] via ORAL
  Filled 2019-09-16 (×14): qty 1

## 2019-09-16 MED ORDER — IBUPROFEN 200 MG PO TABS
400.0000 mg | ORAL_TABLET | ORAL | Status: DC | PRN
  Administered 2019-09-16 – 2019-09-29 (×23): 400 mg via ORAL
  Filled 2019-09-16: qty 2
  Filled 2019-09-16: qty 1
  Filled 2019-09-16: qty 2
  Filled 2019-09-16: qty 1
  Filled 2019-09-16 (×3): qty 2
  Filled 2019-09-16 (×3): qty 1
  Filled 2019-09-16: qty 2
  Filled 2019-09-16: qty 1
  Filled 2019-09-16 (×7): qty 2
  Filled 2019-09-16: qty 1
  Filled 2019-09-16 (×3): qty 2

## 2019-09-16 MED ORDER — GADOBUTROL 1 MMOL/ML IV SOLN
7.0000 mL | Freq: Once | INTRAVENOUS | Status: AC | PRN
  Administered 2019-09-16: 7 mL via INTRAVENOUS

## 2019-09-16 MED ORDER — ACETAMINOPHEN 325 MG PO TABS
650.0000 mg | ORAL_TABLET | Freq: Four times a day (QID) | ORAL | Status: DC | PRN
  Administered 2019-09-20 (×2): 650 mg via ORAL
  Filled 2019-09-16 (×2): qty 2

## 2019-09-16 MED ORDER — VITAMIN B-12 1000 MCG PO TABS
1000.0000 ug | ORAL_TABLET | Freq: Every day | ORAL | Status: DC
  Administered 2019-09-16 – 2019-09-29 (×13): 1000 ug via ORAL
  Filled 2019-09-16 (×13): qty 1
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
## 2019-09-16 NOTE — Progress Notes (Signed)
Called for bed placment at [facility] at 10:52. No beds currently available , pt will wait at home.   Laverle Patter, RN

## 2019-09-16 NOTE — Assessment & Plan Note (Signed)
Her cellulitis of her leg is worse.  She has worsening erythema now involving both sides of her leg, worsening wt bearing.  Will direct admit her for IV anbx and for ortho eval.  Spoke with hospitalist.  Pt is in agreement with this plan.

## 2019-09-16 NOTE — Progress Notes (Signed)
Pharmacy Antibiotic Note  Rebecca Pittman is a 79 y.o. female admitted on 09/16/2019 with wound infection.  Pharmacy has been consulted for cefepime dosing.  Plan:  Cefepime 2g IV q12 hr  Pharmacy will follow peripherally for new culture results, as well as changes in renal function or clinical status  Height: 5\' 2"  (157.5 cm) Weight: 148 lb (67.1 kg) IBW/kg (Calculated) : 50.1  Temp (24hrs), Avg:98.2 F (36.8 C), Min:98 F (36.7 C), Max:98.3 F (36.8 C)  Recent Labs  Lab 09/13/19 1430  WBC 8.6  CREATININE 0.81    Estimated Creatinine Clearance: 52.2 mL/min (by C-G formula based on SCr of 0.81 mg/dL).    Allergies  Allergen Reactions  . Diphenhydramine Hcl Other (See Comments)    Given this to counteract an allergic reaction and condition worsened  . Other Other (See Comments)    States she had a reaction to a "gel" given to her after eye surgery.  . Sulfamethoxazole-Trimethoprim Other (See Comments)    Patient is not sure if allergy to this or contrast dye because they were given the same day, patient had a hard time breathing. Given Benadryl to counteract and condition worsened    Antimicrobials this admission: 3/19 cefepime >>   Dose adjustments this admission: n/a  Microbiology results: none  Thank you for allowing pharmacy to be a part of this patient's care.  Gerrie Castiglia A 09/16/2019 5:22 PM

## 2019-09-16 NOTE — Telephone Encounter (Signed)
Patient called back to report that her ankles were worse, had lots of questions, was hoping Dr [name] had an appointment available today.  Patient scheduled at 10:15 as there was an open slot on Dr Hatcher's schedule. Landis Gandy, RN

## 2019-09-16 NOTE — Progress Notes (Signed)
Patient is not taking any medication at this time.  Would like this documented for hospital admission. Done. Landis Gandy, RN

## 2019-09-17 DIAGNOSIS — Z888 Allergy status to other drugs, medicaments and biological substances status: Secondary | ICD-10-CM

## 2019-09-17 DIAGNOSIS — Z881 Allergy status to other antibiotic agents status: Secondary | ICD-10-CM

## 2019-09-17 DIAGNOSIS — Z87828 Personal history of other (healed) physical injury and trauma: Secondary | ICD-10-CM

## 2019-09-17 DIAGNOSIS — L03116 Cellulitis of left lower limb: Principal | ICD-10-CM

## 2019-09-17 LAB — CBC
HCT: 34.9 % — ABNORMAL LOW (ref 36.0–46.0)
Hemoglobin: 11.1 g/dL — ABNORMAL LOW (ref 12.0–15.0)
MCH: 31.1 pg (ref 26.0–34.0)
MCHC: 31.8 g/dL (ref 30.0–36.0)
MCV: 97.8 fL (ref 80.0–100.0)
Platelets: 272 10*3/uL (ref 150–400)
RBC: 3.57 MIL/uL — ABNORMAL LOW (ref 3.87–5.11)
RDW: 13.1 % (ref 11.5–15.5)
WBC: 5.9 10*3/uL (ref 4.0–10.5)
nRBC: 0 % (ref 0.0–0.2)

## 2019-09-17 LAB — BASIC METABOLIC PANEL
Anion gap: 7 (ref 5–15)
BUN: 21 mg/dL (ref 8–23)
CO2: 26 mmol/L (ref 22–32)
Calcium: 8.7 mg/dL — ABNORMAL LOW (ref 8.9–10.3)
Chloride: 109 mmol/L (ref 98–111)
Creatinine, Ser: 0.8 mg/dL (ref 0.44–1.00)
GFR calc Af Amer: >60 mL/min (ref >60)
GFR calc non Af Amer: >60 mL/min (ref >60)
Glucose, Bld: 105 mg/dL — ABNORMAL HIGH (ref 70–99)
Potassium: 4.6 mmol/L (ref 3.5–5.1)
Sodium: 142 mmol/L (ref 135–145)

## 2019-09-17 MED ORDER — SODIUM CHLORIDE 0.9 % IV SOLN
270.0000 mg | Freq: Every day | INTRAVENOUS | Status: DC
  Filled 2019-09-17: qty 5.4

## 2019-09-17 MED ORDER — SODIUM CHLORIDE 0.9 % IV SOLN
270.0000 mg | Freq: Once | INTRAVENOUS | Status: AC
  Administered 2019-09-17: 270 mg via INTRAVENOUS
  Filled 2019-09-17: qty 5.4

## 2019-09-17 MED ORDER — PROBIOTIC COMPLEX ACIDOPHILUS PO CAPS: ORAL_CAPSULE | ORAL | Status: DC | PRN

## 2019-09-17 MED ORDER — SACCHAROMYCES BOULARDII 250 MG PO CAPS: 250.0000 mg | ORAL_CAPSULE | ORAL | Status: DC | PRN

## 2019-09-17 MED ORDER — SODIUM CHLORIDE 0.9 % IV SOLN
270.0000 mg | INTRAVENOUS | Status: DC
  Administered 2019-09-18 – 2019-09-19 (×2): 270 mg via INTRAVENOUS
  Filled 2019-09-17 (×2): qty 5.4
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
## 2019-09-18 LAB — MRSA PCR SCREENING: MRSA by PCR: NEGATIVE

## 2019-09-18 MED ORDER — LACTATED RINGERS IV SOLN: INTRAVENOUS | Status: DC
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
## 2019-09-18 NOTE — Progress Notes (Signed)
PT Cancellation Note  Patient Details Name: Rebecca Pittman MRN: [id number] DOB: [birth year]   Cancelled Treatment:     PT order received but eval deferred - pt screened by OT and is IND with mobility.  No PT needs at this time and PT service will sign off.  Debe Coder PT Acute Rehabilitation Services Pager [phone] Office [phone]    [place] 09/18/2019, 1:05 PM
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
## 2019-09-18 NOTE — Plan of Care (Signed)
  Problem: Education: Goal: Knowledge of General Education information will improve Description: Including pain rating scale, medication(s)/side effects and non-pharmacologic comfort measures Outcome: Progressing   Problem: Health Behavior/Discharge Planning: Goal: Ability to manage health-related needs will improve Outcome: Progressing   Problem: Clinical Measurements: Goal: Ability to maintain clinical measurements within normal limits will improve Outcome: Progressing Goal: Respiratory complications will improve Outcome: Progressing Goal: Cardiovascular complication will be avoided Outcome: Progressing   Problem: Nutrition: Goal: Adequate nutrition will be maintained Outcome: Progressing   

## 2019-09-19 ENCOUNTER — Inpatient Hospital Stay (HOSPITAL_COMMUNITY): Payer: Medicare HMO

## 2019-09-19 DIAGNOSIS — R600 Localized edema: Secondary | ICD-10-CM

## 2019-09-19 DIAGNOSIS — M79609 Pain in unspecified limb: Secondary | ICD-10-CM

## 2019-09-19 DIAGNOSIS — M79662 Pain in left lower leg: Secondary | ICD-10-CM

## 2019-09-19 LAB — COMPREHENSIVE METABOLIC PANEL
ALT: 12 U/L (ref 0–44)
AST: 15 U/L (ref 15–41)
Albumin: 3.2 g/dL — ABNORMAL LOW (ref 3.5–5.0)
Alkaline Phosphatase: 64 U/L (ref 38–126)
Anion gap: 7 (ref 5–15)
BUN: 17 mg/dL (ref 8–23)
CO2: 25 mmol/L (ref 22–32)
Calcium: 8.7 mg/dL — ABNORMAL LOW (ref 8.9–10.3)
Chloride: 111 mmol/L (ref 98–111)
Creatinine, Ser: 0.7 mg/dL (ref 0.44–1.00)
GFR calc Af Amer: >60 mL/min (ref >60)
GFR calc non Af Amer: >60 mL/min (ref >60)
Glucose, Bld: 95 mg/dL (ref 70–99)
Potassium: 3.9 mmol/L (ref 3.5–5.1)
Sodium: 143 mmol/L (ref 135–145)
Total Bilirubin: 0.5 mg/dL (ref 0.3–1.2)
Total Protein: 6 g/dL — ABNORMAL LOW (ref 6.5–8.1)

## 2019-09-19 LAB — CBC
HCT: 34 % — ABNORMAL LOW (ref 36.0–46.0)
Hemoglobin: 10.6 g/dL — ABNORMAL LOW (ref 12.0–15.0)
MCH: 29.9 pg (ref 26.0–34.0)
MCHC: 31.2 g/dL (ref 30.0–36.0)
MCV: 95.8 fL (ref 80.0–100.0)
Platelets: 259 10*3/uL (ref 150–400)
RBC: 3.55 MIL/uL — ABNORMAL LOW (ref 3.87–5.11)
RDW: 12.9 % (ref 11.5–15.5)
WBC: 6.7 10*3/uL (ref 4.0–10.5)
nRBC: 0 % (ref 0.0–0.2)

## 2019-09-19 LAB — HIV ANTIBODY (ROUTINE TESTING W REFLEX): HIV Screen 4th Generation wRfx: NONREACTIVE

## 2019-09-19 NOTE — Care Management Important Message (Signed)
Important Message  Patient Details IM Letter given to [facility] Case Manager to present to the Patient Name: Rebecca Pittman MRN: [id number] Date of Birth: 10-08-1940   Medicare Important Message Given:  Yes     Kerin Salen 09/19/2019, 2:00 PM
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
## 2019-09-19 NOTE — Progress Notes (Signed)
Left lower extremity venous duplex completed. Refer to "CV Proc" under chart review to view preliminary results.  09/19/2019 10:21 AM Kelby Aline., MHA, RVT, RDCS, RDMS

## 2019-09-19 NOTE — Progress Notes (Signed)
INFECTIOUS DISEASE PROGRESS NOTE  ID: Rebecca Pittman is a 79 y.o. female with  Active Problems:   Pain and swelling of left ankle  Subjective: Slightly better, still some pain with wt bearing.   Abtx:  Anti-infectives (From admission, onward)   Start     Dose/Rate Route Frequency Ordered Stop   09/18/19 2000  DAPTOmycin (CUBICIN) 270 mg in sodium chloride 0.9 % IVPB     270 mg 210.8 mL/hr over 30 Minutes Intravenous Every 24 hours 09/17/19 1517     09/17/19 1530  DAPTOmycin (CUBICIN) 270 mg in sodium chloride 0.9 % IVPB  Status:  Discontinued     270 mg 210.8 mL/hr over 30 Minutes Intravenous Daily 09/17/19 1517 09/17/19 1518   09/17/19 1530  DAPTOmycin (CUBICIN) 270 mg in sodium chloride 0.9 % IVPB     270 mg 210.8 mL/hr over 30 Minutes Intravenous  Once 09/17/19 1518 09/17/19 1632   09/16/19 1600  ceFEPIme (MAXIPIME) 2 g in sodium chloride 0.9 % 100 mL IVPB     2 g 200 mL/hr over 30 Minutes Intravenous Every 12 hours 09/16/19 1547        Medications:  Scheduled: . cholecalciferol  1,000 Units Oral Daily  . enoxaparin (LOVENOX) injection  40 mg Subcutaneous Q24H  . vitamin B-12  1,000 mcg Oral Daily    Objective: Vital signs in last 24 hours: Temp:  [97.6 F (36.4 C)-98 F (36.7 C)] 97.6 F (36.4 C) (03/22 [id number]) Pulse Rate:  [70-79] 70 (03/22 0632) Resp:  [16-18] 18 (03/22 [id number]) BP: (104-135)/(37-66) 135/62 (03/22 0632) SpO2:  [98 %-100 %] 98 % (03/22 [id number])   General appearance: alert, cooperative and no distress Resp: clear to auscultation bilaterally Cardio: regular rate and rhythm GI: normal findings: bowel sounds normal and soft, non-tender Extremities: erythema slightly decreased on her medial and lateral ankle.   Lab Results Recent Labs    09/17/19 0350 09/19/19 0438  WBC 5.9 6.7  HGB 11.1* 10.6*  HCT 34.9* 34.0*  NA 142 143  K 4.6 3.9  CL 109 111  CO2 26 25  BUN 21 17  CREATININE 0.80 0.70   Liver Panel Recent Labs    09/19/19 0438    PROT 6.0*  ALBUMIN 3.2*  AST 15  ALT 12  ALKPHOS 64  BILITOT 0.5   Sedimentation Rate No results for input(s): ESRSEDRATE in the last 72 hours. C-Reactive Protein No results for input(s): CRP in the last 72 hours.  Microbiology: Recent Results (from the past 240 hour(s))  Culture, blood (routine x 2)     Status: None (Preliminary result)   Collection Time: 09/17/19  3:27 PM   Specimen: BLOOD  Result Value Ref Range Status   Specimen Description   Final    BLOOD LEFT ANTECUBITAL Performed at [facility], [address], [place], [place] [address]    Special Requests   Final    BOTTLES DRAWN AEROBIC AND ANAEROBIC Blood Culture adequate volume Performed at [facility] [address], [place], [place] [address]    Culture   Final    NO GROWTH < 12 HOURS Performed at [facility] [address], [place], [place] [address]    Report Status PENDING  Incomplete  Culture, blood (routine x 2)     Status: None (Preliminary result)   Collection Time: 09/17/19  3:39 PM   Specimen: BLOOD  Result Value Ref Range Status   Specimen Description  Final    BLOOD LEFT ARM Performed at [facility] [address] [place], [place] [address]    Special Requests   Final    BOTTLES DRAWN AEROBIC AND ANAEROBIC Blood Culture adequate volume Performed at [facility] [address], [place], [place] [address]    Culture   Final    NO GROWTH < 12 HOURS Performed at [facility] [address] [place], [place] [address]    Report Status PENDING  Incomplete  MRSA PCR Screening     Status: None   Collection Time: 09/18/19  6:30 PM   Specimen: Nasal Mucosa; Nasopharyngeal  Result Value Ref Range Status   MRSA by PCR NEGATIVE NEGATIVE Final    Comment:        The GeneXpert MRSA Assay (FDA approved for NASAL specimens only), is one component of a comprehensive MRSA colonization surveillance program. It is  not intended to diagnose MRSA infection nor to guide or monitor treatment for MRSA infections. Performed at [facility], [place] [address], [place], [place] [address]     Studies/Results: VAS [place] LOWER EXTREMITY VENOUS (DVT)  Result Date: 09/19/2019  Lower Venous DVTStudy Indications: Pain.  Comparison Study: No prior study Performing Technologist: Maudry Mayhew MHA, RDMS, RVT, RDCS  Examination Guidelines: A complete evaluation includes B-mode imaging, spectral Doppler, color Doppler, and power Doppler as needed of all accessible portions of each vessel. Bilateral testing is considered an integral part of a complete examination. Limited examinations for reoccurring indications may be performed as noted. The reflux portion of the exam is performed with the patient in reverse Trendelenburg.  +-----+---------------+---------+-----------+----------+--------------+ RIGHTCompressibilityPhasicitySpontaneityPropertiesThrombus Aging +-----+---------------+---------+-----------+----------+--------------+ CFV  Full           Yes      Yes                                 +-----+---------------+---------+-----------+----------+--------------+   +---------+---------------+---------+-----------+----------+--------------+ LEFT     CompressibilityPhasicitySpontaneityPropertiesThrombus Aging +---------+---------------+---------+-----------+----------+--------------+ CFV      Full           Yes      Yes                                 +---------+---------------+---------+-----------+----------+--------------+ SFJ      Full                                                        +---------+---------------+---------+-----------+----------+--------------+ FV Prox  Full                                                        +---------+---------------+---------+-----------+----------+--------------+ FV Mid   Full                                                         +---------+---------------+---------+-----------+----------+--------------+ FV DistalFull                                                        +---------+---------------+---------+-----------+----------+--------------+  PFV      Full                                                        +---------+---------------+---------+-----------+----------+--------------+ POP      Full           Yes      Yes                                 +---------+---------------+---------+-----------+----------+--------------+ PTV      Full                                                        +---------+---------------+---------+-----------+----------+--------------+ PERO     Full                                                        +---------+---------------+---------+-----------+----------+--------------+     Summary: RIGHT: - No evidence of common femoral vein obstruction.  LEFT: - There is no evidence of deep vein thrombosis in the lower extremity.  - No cystic structure found in the popliteal fossa.  *See table(s) above for measurements and observations.    Preliminary      Assessment/Plan: Cellulitis  Total days of antibiotics: 3 dapto/cefepime  Follow CK Continue anbx Place PIC Have derm see her ortho would be another consideration but her MRi does not show changes consistent with bone infection.  Will continue to watch.          Bobby Rumpf MD, FACP Infectious Diseases (pager) [phone] [email or web address] 09/19/2019, 10:32 AM  LOS: 3 days

## 2019-09-20 ENCOUNTER — Other Ambulatory Visit: Payer: Self-pay | Admitting: Physician Assistant

## 2019-09-20 DIAGNOSIS — M86272 Subacute osteomyelitis, left ankle and foot: Secondary | ICD-10-CM

## 2019-09-20 LAB — URIC ACID: Uric Acid, Serum: 3.8 mg/dL (ref 2.5–7.1)

## 2019-09-20 LAB — CK: Total CK: 32 U/L — ABNORMAL LOW (ref 38–234)

## 2019-09-20 MED ORDER — FLUCONAZOLE 100 MG PO TABS
100.0000 mg | ORAL_TABLET | Freq: Every day | ORAL | Status: DC
  Filled 2019-09-20: qty 1
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
## 2019-09-20 NOTE — Plan of Care (Signed)
  Problem: Clinical Measurements: Goal: Will remain free from infection Outcome: Progressing   Problem: Clinical Measurements: Goal: Respiratory complications will improve Outcome: Progressing   Problem: Clinical Measurements: Goal: Cardiovascular complication will be avoided Outcome: Progressing   Problem: Skin Integrity: Goal: Risk for impaired skin integrity will decrease Outcome: Progressing
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
## 2019-09-21 ENCOUNTER — Inpatient Hospital Stay (HOSPITAL_COMMUNITY): Payer: Medicare HMO | Admitting: Anesthesiology

## 2019-09-21 ENCOUNTER — Encounter (HOSPITAL_COMMUNITY): Payer: Self-pay | Admitting: Internal Medicine

## 2019-09-21 ENCOUNTER — Encounter (HOSPITAL_COMMUNITY)
Admission: AD | Disposition: A | Discharge status: Skilled Nursing Facility | Payer: Self-pay | Source: Home / Self Care | Attending: Family Medicine

## 2019-09-21 HISTORY — PX: I & D EXTREMITY: SHX5045

## 2019-09-21 LAB — RESPIRATORY PANEL BY RT PCR (FLU A&B, COVID)
Influenza A by PCR: NEGATIVE
Influenza B by PCR: NEGATIVE
SARS Coronavirus 2 by RT PCR: NEGATIVE

## 2019-09-21 LAB — GLUCOSE, CAPILLARY
Glucose-Capillary: 123 mg/dL — ABNORMAL HIGH (ref 70–99)
Glucose-Capillary: 126 mg/dL — ABNORMAL HIGH (ref 70–99)

## 2019-09-21 SURGERY — IRRIGATION AND DEBRIDEMENT EXTREMITY
Anesthesia: General | Laterality: Left

## 2019-09-21 MED ORDER — HYDROMORPHONE HCL 1 MG/ML IJ SOLN
0.2500 mg | INTRAMUSCULAR | Status: DC | PRN
  Administered 2019-09-21 (×2): 0.5 mg via INTRAVENOUS

## 2019-09-21 MED ORDER — FENTANYL CITRATE (PF) 250 MCG/5ML IJ SOLN
INTRAMUSCULAR | Status: DC | PRN
  Administered 2019-09-21 (×4): 50 ug via INTRAVENOUS

## 2019-09-21 MED ORDER — ONDANSETRON HCL 4 MG/2ML IJ SOLN
INTRAMUSCULAR | Status: DC | PRN
  Administered 2019-09-21: 4 mg via INTRAVENOUS

## 2019-09-21 MED ORDER — DOCUSATE SODIUM 100 MG PO CAPS
100.0000 mg | ORAL_CAPSULE | Freq: Two times a day (BID) | ORAL | Status: DC
  Administered 2019-09-21 – 2019-09-24 (×7): 100 mg via ORAL
  Filled 2019-09-21 (×12): qty 1

## 2019-09-21 MED ORDER — CHLORHEXIDINE GLUCONATE 4 % EX LIQD
60.0000 mL | Freq: Once | CUTANEOUS | Status: AC
  Administered 2019-09-21: 4 via TOPICAL

## 2019-09-21 MED ORDER — LACTATED RINGERS IV SOLN: INTRAVENOUS | Status: DC

## 2019-09-21 MED ORDER — HYDROMORPHONE HCL 1 MG/ML IJ SOLN
0.5000 mg | INTRAMUSCULAR | Status: DC | PRN
  Administered 2019-09-22 – 2019-09-27 (×2): 0.5 mg via INTRAVENOUS
  Filled 2019-09-21 (×2): qty 1

## 2019-09-21 MED ORDER — HYDROMORPHONE HCL 1 MG/ML IJ SOLN
INTRAMUSCULAR | Status: AC
  Filled 2019-09-21: qty 1

## 2019-09-21 MED ORDER — 0.9 % SODIUM CHLORIDE (POUR BTL) OPTIME
TOPICAL | Status: DC | PRN
  Administered 2019-09-21: 1000 mL

## 2019-09-21 MED ORDER — EPHEDRINE SULFATE 50 MG/ML IJ SOLN
INTRAMUSCULAR | Status: DC | PRN
  Administered 2019-09-21: 10 mg via INTRAVENOUS

## 2019-09-21 MED ORDER — CEFAZOLIN SODIUM-DEXTROSE 1-4 GM/50ML-% IV SOLN
1.0000 g | Freq: Four times a day (QID) | INTRAVENOUS | Status: AC
  Administered 2019-09-21 – 2019-09-22 (×3): 1 g via INTRAVENOUS
  Filled 2019-09-21 (×3): qty 50

## 2019-09-21 MED ORDER — FENTANYL CITRATE (PF) 100 MCG/2ML IJ SOLN
INTRAMUSCULAR | Status: AC
  Filled 2019-09-21: qty 2

## 2019-09-21 MED ORDER — CEFAZOLIN SODIUM-DEXTROSE 2-4 GM/100ML-% IV SOLN
2.0000 g | INTRAVENOUS | Status: AC
  Administered 2019-09-21: 12:00:00 2 g via INTRAVENOUS
  Filled 2019-09-21: qty 100

## 2019-09-21 MED ORDER — PROPOFOL 10 MG/ML IV BOLUS
INTRAVENOUS | Status: DC | PRN
  Administered 2019-09-21: 130 mg via INTRAVENOUS

## 2019-09-21 MED ORDER — SODIUM CHLORIDE 0.9 % IV SOLN: INTRAVENOUS | Status: DC

## 2019-09-21 MED ORDER — OXYCODONE HCL 5 MG PO TABS
5.0000 mg | ORAL_TABLET | ORAL | Status: DC | PRN
  Administered 2019-09-21 – 2019-09-22 (×4): 5 mg via ORAL
  Administered 2019-09-23: 10 mg via ORAL
  Filled 2019-09-21: qty 2
  Filled 2019-09-21: qty 1
  Filled 2019-09-21 (×2): qty 2
  Filled 2019-09-21: qty 1
  Filled 2019-09-21: qty 2
  Filled 2019-09-21 (×2): qty 1

## 2019-09-21 MED ORDER — LIDOCAINE HCL (CARDIAC) PF 100 MG/5ML IV SOSY
PREFILLED_SYRINGE | INTRAVENOUS | Status: DC | PRN
  Administered 2019-09-21: 80 mg via INTRAVENOUS

## 2019-09-21 MED ORDER — ACETAMINOPHEN 10 MG/ML IV SOLN: 1000.0000 mg | Freq: Once | INTRAVENOUS | Status: DC | PRN

## 2019-09-21 MED ORDER — FENTANYL CITRATE (PF) 100 MCG/2ML IJ SOLN
25.0000 ug | INTRAMUSCULAR | Status: DC | PRN
  Administered 2019-09-21 (×4): 25 ug via INTRAVENOUS

## 2019-09-21 MED ORDER — ENSURE PRE-SURGERY PO LIQD
296.0000 mL | Freq: Once | ORAL | Status: AC
  Administered 2019-09-21: 296 mL via ORAL
  Filled 2019-09-21: qty 296

## 2019-09-21 MED ORDER — ONDANSETRON HCL 4 MG/2ML IJ SOLN: 4.0000 mg | Freq: Once | INTRAMUSCULAR | Status: DC | PRN

## 2019-09-21 MED ORDER — FENTANYL CITRATE (PF) 250 MCG/5ML IJ SOLN
INTRAMUSCULAR | Status: AC
  Filled 2019-09-21: qty 5

## 2019-09-21 SURGICAL SUPPLY — 34 items
BLADE SURG 21 STRL SS (BLADE) ×3 IMPLANT
BNDG COHESIVE 6X5 TAN STRL LF (GAUZE/BANDAGES/DRESSINGS) ×2 IMPLANT
BNDG GAUZE ELAST 4 BULKY (GAUZE/BANDAGES/DRESSINGS) ×6 IMPLANT
COVER SURGICAL LIGHT HANDLE (MISCELLANEOUS) ×6 IMPLANT
COVER WAND RF STERILE (DRAPES) ×3 IMPLANT
DRAPE U-SHAPE 47X51 STRL (DRAPES) ×3 IMPLANT
DRSG ADAPTIC 3X8 NADH LF (GAUZE/BANDAGES/DRESSINGS) ×3 IMPLANT
DURAPREP 26ML APPLICATOR (WOUND CARE) ×3 IMPLANT
ELECT REM PT RETURN 9FT ADLT (ELECTROSURGICAL)
ELECTRODE REM PT RTRN 9FT ADLT (ELECTROSURGICAL) IMPLANT
GAUZE SPONGE 4X4 12PLY STRL (GAUZE/BANDAGES/DRESSINGS) ×3 IMPLANT
GLOVE BIOGEL PI IND STRL 9 (GLOVE) ×1 IMPLANT
GLOVE BIOGEL PI INDICATOR 9 (GLOVE) ×2
GLOVE SURG ORTHO 9.0 STRL STRW (GLOVE) ×3 IMPLANT
GOWN STRL REUS W/ TWL XL LVL3 (GOWN DISPOSABLE) ×2 IMPLANT
GOWN STRL REUS W/TWL XL LVL3 (GOWN DISPOSABLE) ×6
HANDPIECE INTERPULSE COAX TIP (DISPOSABLE)
KIT BASIN OR (CUSTOM PROCEDURE TRAY) ×3 IMPLANT
KIT DRSG PREVENA PLUS 7DAY 125 (MISCELLANEOUS) ×2 IMPLANT
KIT PREVENA INCISION MGT 13 (CANNISTER) ×2 IMPLANT
KIT TURNOVER KIT B (KITS) ×3 IMPLANT
MANIFOLD NEPTUNE II (INSTRUMENTS) ×3 IMPLANT
NS IRRIG 1000ML POUR BTL (IV SOLUTION) ×3 IMPLANT
PACK ORTHO EXTREMITY (CUSTOM PROCEDURE TRAY) ×3 IMPLANT
PAD ARMBOARD 7.5X6 YLW CONV (MISCELLANEOUS) ×6 IMPLANT
SET HNDPC FAN SPRY TIP SCT (DISPOSABLE) IMPLANT
STOCKINETTE IMPERVIOUS 9X36 MD (GAUZE/BANDAGES/DRESSINGS) IMPLANT
SUT ETHILON 2 0 PSLX (SUTURE) ×4 IMPLANT
SWAB COLLECTION DEVICE MRSA (MISCELLANEOUS) ×3 IMPLANT
SWAB CULTURE ESWAB REG 1ML (MISCELLANEOUS) IMPLANT
TOWEL GREEN STERILE (TOWEL DISPOSABLE) ×3 IMPLANT
TUBE CONNECTING 12'X1/4 (SUCTIONS) ×1
TUBE CONNECTING 12X1/4 (SUCTIONS) ×2 IMPLANT
YANKAUER SUCT BULB TIP NO VENT (SUCTIONS) ×3 IMPLANT

## 2019-09-21 NOTE — Anesthesia Preprocedure Evaluation (Addendum)
Anesthesia Evaluation  Patient identified by MRN, date of birth, ID band Patient awake    Reviewed: Allergy & Precautions, NPO status , Patient's Chart, lab work & pertinent test results  Airway Mallampati: II  TM Distance: >3 FB Neck ROM: Full    Dental no notable dental hx. (+) Teeth Intact, Dental Advisory Given   Pulmonary neg pulmonary ROS,    Pulmonary exam normal breath sounds clear to auscultation       Cardiovascular Exercise Tolerance: Good Normal cardiovascular exam Rhythm:Regular Rate:Normal     Neuro/Psych  Headaches, negative psych ROS   GI/Hepatic negative GI ROS, Neg liver ROS,   Endo/Other    Renal/GU K+ 3.9 Cr 0.70     Musculoskeletal   Abdominal   Peds  Hematology  (+) anemia , Hgb 10.6 plt 259   Anesthesia Other Findings   Reproductive/Obstetrics                            Anesthesia Physical Anesthesia Plan  ASA: III  Anesthesia Plan: General   Post-op Pain Management:    Induction:   PONV Risk Score and Plan: Treatment may vary due to age or medical condition, Ondansetron and Dexamethasone  Airway Management Planned: LMA  Additional Equipment: None  Intra-op Plan:   Post-operative Plan:   Informed Consent: I have reviewed the patients History and Physical, chart, labs and discussed the procedure including the risks, benefits and alternatives for the proposed anesthesia with the patient or authorized representative who has indicated his/her understanding and acceptance.     Dental advisory given  Plan Discussed with: CRNA  Anesthesia Plan Comments:        Anesthesia Quick Evaluation

## 2019-09-21 NOTE — Anesthesia Procedure Notes (Signed)
Procedure Name: LMA Insertion Date/Time: 09/21/2019 11:40 AM Performed by: Shirlyn Goltz, CRNA Pre-anesthesia Checklist: Patient identified, Emergency Drugs available, Suction available and Patient being monitored Patient Re-evaluated:Patient Re-evaluated prior to induction Oxygen Delivery Method: Circle system utilized Preoxygenation: Pre-oxygenation with 100% oxygen Induction Type: IV induction LMA: LMA inserted LMA Size: 4.0 Number of attempts: 1 Placement Confirmation: positive ETCO2 and breath sounds checked- equal and bilateral Tube secured with: Tape Dental Injury: Teeth and Oropharynx as per pre-operative assessment

## 2019-09-21 NOTE — Op Note (Signed)
09/21/2019  12:24 PM  PATIENT:  Rebecca Pittman    PRE-OPERATIVE DIAGNOSIS:  Abscess Left Ankle with osteomyelitis  POST-OPERATIVE DIAGNOSIS:  Same  PROCEDURE:  DEBRIDE BONE LEFT ANKLE Partial excision left fibula. Local tissue rearrangement for wound closure 2 x 10 cm. Application of 13 cm Prevena wound VAC.  SURGEON:  Newt Minion, MD  PHYSICIAN ASSISTANT:None ANESTHESIA:   General  PREOPERATIVE INDICATIONS:  Rebecca Pittman is a  79 y.o. female with a diagnosis of Abscess Left Ankle who failed conservative measures and elected for surgical management.    The risks benefits and alternatives were discussed with the patient preoperatively including but not limited to the risks of infection, bleeding, nerve injury, cardiopulmonary complications, the need for revision surgery, among others, and the patient was willing to proceed.  OPERATIVE IMPLANTS: None  @ENCIMAGES @  OPERATIVE FINDINGS: Bone was extremely soft fibrinous scar tissue over the fibula.  Soft tissue skin muscle and bone was sent for cultures including aerobic anaerobic fungal and AFB.  OPERATIVE PROCEDURE: Patient was brought to the operating room and underwent a general anesthetic.  After adequate levels anesthesia obtained patient's left lower extremity was prepped using DuraPrep draped into a sterile field a timeout was called.  A longitudinal elliptical incision was made around the ulcerative tissue.  This was carried down to bone this left a wound that was 2 x 10 cm.  A osteotome and rondure were used to resect the superficial margin of the fibula.  There is no deep abscess.  The bone was extremely soft cortical bone could be easily rongeured.  There was a significant amount of adhesions and scar tissue consistent with a chronic inflammatory process.  This fibrinous tissue was sent for cultures as well.  The wound was irrigated with normal saline electrocautery was used hemostasis.  2-0 nylon was used for wound closure  with local tissue rearrangement for a wound 2 x 10 cm.  The 13 cm Prevena wound VAC was applied this had a good suction fit this was overwrapped with Coban.  Patient was extubated taken the PACU in stable condition.   DISCHARGE PLANNING:  Antibiotic duration: Continue antibiotics as per infectious disease recommendation  Weightbearing: Weightbearing as tolerated on the left  Pain medication: Opioid pathway  Dressing care/ Wound VAC: Continue wound VAC for 1 week after discharge  Ambulatory devices: Walker or wheelchair  Discharge to: Discharge planning as per physical therapy recommendation.  Follow-up: In the office 1 week post operative.

## 2019-09-21 NOTE — Progress Notes (Signed)
PT Cancellation Note  Patient Details Name: Rebecca Pittman MRN: [place]:[id number] DOB: 1941/06/13   Cancelled Treatment:    Reason Eval/Treat Not Completed: Patient declined, no reason specified. Pt reports feeling too groggy at this time to participate in PT intervention, recently having surgery on this date. Pt requesting to defer PT evaluation until tomorrow. PT will attempt to follow up as time allows.   Zenaida Niece 09/21/2019, 3:32 PM
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
## 2019-09-21 NOTE — Interval H&P Note (Signed)
History and Physical Interval Note:  09/21/2019 7:00 AM  Rebecca Pittman  has presented today for surgery, with the diagnosis of Abscess Left Ankle.  The various methods of treatment have been discussed with the patient and family. After consideration of risks, benefits and other options for treatment, the patient has consented to  Procedure(s): DEBRIDE BONE LEFT ANKLE (Left) as a surgical intervention.  The patient's history has been reviewed, patient examined, no change in status, stable for surgery.  I have reviewed the patient's chart and labs.  Questions were answered to the patient's satisfaction.     Newt Minion

## 2019-09-21 NOTE — Transfer of Care (Signed)
Immediate Anesthesia Transfer of Care Note  Patient: Rebecca Pittman  Procedure(s) Performed: DEBRIDE BONE LEFT ANKLE (Left )  Patient Location: PACU  Anesthesia Type:General  Level of Consciousness: awake, alert , oriented and patient cooperative  Airway & Oxygen Therapy: Patient Spontanous Breathing and Patient connected to nasal cannula oxygen  Post-op Assessment: Report given to RN and Post -op Vital signs reviewed and stable  Post vital signs: Reviewed and stable  Last Vitals:  Vitals Value Taken Time  BP 126/75 09/21/19 1215  Temp    Pulse 86 09/21/19 1215  Resp 11 09/21/19 1215  SpO2 99 % 09/21/19 1215    Last Pain:  Vitals:   09/21/19 0533  TempSrc: Oral  PainSc:       Patients Stated Pain Goal: 2 ([id number])  Complications: No apparent anesthesia complications

## 2019-09-21 NOTE — TOC Initial Note (Signed)
Transition of Care [facility]) - Initial/Assessment Note    Patient Details  Name: Rebecca Pittman MRN: [id number] Date of Birth: 10-05-1940  Transition of [facility]) CM/SW Contact:    Curlene Labrum, RN Phone Number: 09/21/2019, 4:52 PM  Clinical Narrative:    Patient is a 79 year old female who lives at home alone with assistance from two daughters, one living in [place], and the other in [place], [place].  Patient is S/P Left ankle/ fibula bone debridement by Dr. [name].  Patient currently has no DME at home and will speak with the patient tomorrow to arrange delivery of equipment to the room after PT ambulates with the patient.    Patient will be transported home by the daughter's upon discharge.  Patient uses the [facility] in [place], [place] for medications.  Daughters given Medicare Choice for home health services.  I will see the patient tomorrow to find home health needs for the patient after PT sees the patient as well.               Expected Discharge Plan: [place] Barriers to Discharge: No Barriers Identified   Patient Goals and CMS Choice Patient states their goals for this hospitalization and ongoing recovery are:: Patient sleeping but daughter's state that the patient did well in surgery and looks forward to returning home. CMS Medicare.gov Compare Post Acute Care list provided to:: Other (Comment Required)(Medicare choice given to daughters) Choice offered to / list presented to : Adult Children  Expected Discharge Plan and Services Expected Discharge Plan: [place]   Discharge Planning Services: CM Consult Post Acute Care Choice: Durable Medical Equipment Living arrangements for the past 2 months: Apartment                                      Prior Living Arrangements/Services Living arrangements for the past 2 months: Apartment Lives with:: Self Patient language and need for interpreter reviewed:: Yes Do you feel safe  going back to the place where you live?: Yes      Need for Family Participation in Patient Care: Yes (Comment) Care giver support system in place?: Yes (comment)   Criminal Activity/Legal Involvement Pertinent to Current Situation/Hospitalization: No - Comment as needed  Activities of Daily Living Home Assistive Devices/Equipment: None ADL Screening (condition at time of admission) Patient's cognitive ability adequate to safely complete daily activities?: Yes Is the patient deaf or have difficulty hearing?: No Does the patient have difficulty seeing, even when wearing glasses/contacts?: No Does the patient have difficulty concentrating, remembering, or making decisions?: No Patient able to express need for assistance with ADLs?: Yes Does the patient have difficulty dressing or bathing?: No Independently performs ADLs?: Yes (appropriate for developmental age) Does the patient have difficulty walking or climbing stairs?: No Weakness of Legs: None Weakness of Arms/Hands: None  Permission Sought/Granted Permission sought to share information with : Case Manager Permission granted to share information with : Yes, Verbal Permission Granted              Emotional Assessment Appearance:: Appears stated age Attitude/Demeanor/Rapport: Engaged Affect (typically observed): Calm Orientation: : Oriented to Self, Oriented to Place, Oriented to  Time, Oriented to Situation Alcohol / Substance Use: Not Applicable Psych Involvement: No (comment)  Admission diagnosis:  Ankle injuries, left, sequela [S99.912S] Patient Active Problem List   Diagnosis Date Noted  .  Subacute osteomyelitis, left ankle and foot ([place])   . Hyperlipidemia 08/03/2019  . Chest pain, unspecified 08/03/2019  . Left leg swelling 01/13/2019  . Sprain of wrist 01/13/2019  . Cellulitis of left ankle 12/07/2018  . Pain and swelling of left ankle 11/29/2018  . Ecchymosis 04/09/2018  . Headache 04/09/2018  . Hematoma of  left chest wall 04/09/2018  . Laceration of left lower extremity 04/09/2018  . Right thigh pain 04/09/2018  . Fracture of multiple ribs of left side 04/08/2018  . Spider bite 04/30/2017  . Dermatochalasis 07/10/2011   PCP:  Greig Right, MD Pharmacy:   [facility] [address], [place] [id number] [place] [id number] Phone: [phone] Fax: [phone]     Social Determinants of Health (SDOH) Interventions    Readmission Risk Interventions No flowsheet data found.

## 2019-09-21 NOTE — Anesthesia Postprocedure Evaluation (Signed)
Anesthesia Post Note  Patient: Rebecca Pittman  Procedure(s) Performed: DEBRIDE BONE LEFT ANKLE (Left )     Patient location during evaluation: PACU Anesthesia Type: General Level of consciousness: awake and alert Pain management: pain level controlled Vital Signs Assessment: post-procedure vital signs reviewed and stable Respiratory status: spontaneous breathing, nonlabored ventilation, respiratory function stable and patient connected to nasal cannula oxygen Cardiovascular status: blood pressure returned to baseline and stable Postop Assessment: no apparent nausea or vomiting Anesthetic complications: no    Last Vitals:  Vitals:   09/21/19 1243 09/21/19 1245  BP: 120/78   Pulse: 69 66  Resp: 17 12  Temp:    SpO2: 100% 100%    Last Pain:  Vitals:   09/21/19 1245  TempSrc:   PainSc: 10-Worst pain ever                 Barnet Glasgow

## 2019-09-22 DIAGNOSIS — M25472 Effusion, left ankle: Secondary | ICD-10-CM

## 2019-09-22 DIAGNOSIS — S99912S Unspecified injury of left ankle, sequela: Secondary | ICD-10-CM

## 2019-09-22 DIAGNOSIS — M25572 Pain in left ankle and joints of left foot: Secondary | ICD-10-CM

## 2019-09-22 LAB — CULTURE, BLOOD (ROUTINE X 2)
Culture: NO GROWTH
Culture: NO GROWTH
Special Requests: ADEQUATE
Special Requests: ADEQUATE

## 2019-09-22 LAB — CBC
HCT: 30.5 % — ABNORMAL LOW (ref 36.0–46.0)
Hemoglobin: 9.6 g/dL — ABNORMAL LOW (ref 12.0–15.0)
MCH: 30.8 pg (ref 26.0–34.0)
MCHC: 31.5 g/dL (ref 30.0–36.0)
MCV: 97.8 fL (ref 80.0–100.0)
Platelets: 210 10*3/uL (ref 150–400)
RBC: 3.12 MIL/uL — ABNORMAL LOW (ref 3.87–5.11)
RDW: 13.3 % (ref 11.5–15.5)
WBC: 8.2 10*3/uL (ref 4.0–10.5)
nRBC: 0 % (ref 0.0–0.2)

## 2019-09-22 LAB — BASIC METABOLIC PANEL
Anion gap: 7 (ref 5–15)
BUN: 8 mg/dL (ref 8–23)
CO2: 25 mmol/L (ref 22–32)
Calcium: 8.2 mg/dL — ABNORMAL LOW (ref 8.9–10.3)
Chloride: 110 mmol/L (ref 98–111)
Creatinine, Ser: 0.77 mg/dL (ref 0.44–1.00)
GFR calc Af Amer: >60 mL/min (ref >60)
GFR calc non Af Amer: >60 mL/min (ref >60)
Glucose, Bld: 92 mg/dL (ref 70–99)
Potassium: 4.6 mmol/L (ref 3.5–5.1)
Sodium: 142 mmol/L (ref 135–145)

## 2019-09-22 LAB — ACID FAST SMEAR (AFB, MYCOBACTERIA): Acid Fast Smear: NEGATIVE

## 2019-09-22 LAB — GLUCOSE, CAPILLARY: Glucose-Capillary: 76 mg/dL (ref 70–99)

## 2019-09-22 MED ORDER — SODIUM CHLORIDE 0.9 % IV SOLN
500.0000 mg | INTRAVENOUS | Status: DC
  Administered 2019-09-22 – 2019-09-24 (×3): 500 mg via INTRAVENOUS
  Filled 2019-09-22 (×5): qty 10

## 2019-09-22 MED ORDER — SODIUM CHLORIDE 0.9 % IV SOLN
270.0000 mg | INTRAVENOUS | Status: DC
  Filled 2019-09-22: qty 5.4

## 2019-09-22 MED ORDER — ACETAMINOPHEN 500 MG PO TABS
1000.0000 mg | ORAL_TABLET | Freq: Three times a day (TID) | ORAL | Status: AC
  Administered 2019-09-22 – 2019-09-25 (×9): 1000 mg via ORAL
  Filled 2019-09-22 (×8): qty 2

## 2019-09-22 MED ORDER — SODIUM CHLORIDE 0.9 % IV SOLN
2.0000 g | Freq: Two times a day (BID) | INTRAVENOUS | Status: DC
  Administered 2019-09-22 – 2019-09-29 (×15): 2 g via INTRAVENOUS
  Filled 2019-09-22 (×15): qty 2

## 2019-09-22 NOTE — Progress Notes (Signed)
Patient refused to get up to the bedside commode. She c/o throbbing pain every time she raises her leg to sit or to get up. Patient uses bedpan at this time. Patient was educated on the importance of getting up and putting weight as tolerated on the leg so it won't be stiff. Patient also hesitates on getting pain medication but she was educated on the importance of taking the medication for pain control so she can be able to move around again. Patient verbalizes understanding. Will continue to monitor patient.
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
## 2019-09-22 NOTE — Progress Notes (Signed)
Patient is postop day 1 status post left ankle debridement.  She does say that she feels like she has less pain than prior to surgery.  She is trying to wiggle her toes and wonders if this is okay I reassured her it was.  There is 0 cc in the canister.  Cultures and AFB are pending and negative.

## 2019-09-22 NOTE — Progress Notes (Addendum)
PHARMACY CONSULT NOTE FOR:  OUTPATIENT  PARENTERAL ANTIBIOTIC THERAPY (OPAT)  Indication: osteomyelitis  Regimen: Daptomycin 500 mg iv Q 24 hours, Cefepime 2 grams iv Q 12 End date: 10/14/19  IV antibiotic discharge orders are pended. To discharging provider:  please sign these orders via discharge navigator,  Select New Orders & click on the button choice - Manage This Unsigned Work.     Thank you for allowing pharmacy to be a part of this patient's care.  Claudina Lick, PharmD Candidate  09/22/2019, 11:15 AM

## 2019-09-22 NOTE — TOC Progression Note (Signed)
Transition of Care [facility]) - Progression Note    Patient Details  Name: Rebecca Pittman MRN: [id number] Date of Birth: [birth year]  Transition of Care [facility]) CM/SW [place], RN Phone Number: 09/22/2019, 2:12 PM  Clinical Narrative:     Case management spoke with the patient at the bedside and the patient explained that the PT made an evaluation and assessment of the patient and suggested that the patient transfer to a SNF for rehab before going home.  Patient was understanding and accepted SNF workup.  Patient  Was given Medicare Choice regarding SNF facilities and I will waiting for pending PT note.    Expected Discharge Plan: [place] Barriers to Discharge: No Barriers Identified  Expected Discharge Plan and Services Expected Discharge Plan: [place]   Discharge Planning Services: CM Consult Post Acute Care Choice: Durable Medical Equipment Living arrangements for the past 2 months: Apartment                                       Social Determinants of Health (SDOH) Interventions    Readmission Risk Interventions No flowsheet data found.
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
## 2019-09-22 NOTE — Evaluation (Signed)
Occupational Therapy Evaluation Patient Details Name: Rebecca Pittman MRN: [id number] DOB: Oct 28, 1940 Today's Date: 09/22/2019    History of Present Illness Rebecca Pittman is a 79 y.o. female presenting with persistent redness and swelling with cellulitis  of the L lateral ankle. She underwent  debridement of fibula on the Left ankle > likely osteomyelitis.  PMH includes:  cervical disc disease    Clinical Impression   Pt admitted with above. She demonstrates the below listed deficits and will benefit from continued OT to maximize safety and independence with BADLs.   Pt significantly limited by pain, and was only able to move to EOB sitting very briefly before immediately returning to supine.  She currently requires min - total A for ADLs (except self feeding and grooming) at bed level.  She lives alone and was mod I PTA.  Recommend SNF.       Follow Up Recommendations  SNF    Equipment Recommendations  None recommended by OT    Recommendations for Other Services       Precautions / Restrictions Precautions Precautions: None Restrictions Weight Bearing Restrictions: Yes RLE Weight Bearing: Weight bearing as tolerated      Mobility Bed Mobility Overal bed mobility: Needs Assistance Bed Mobility: Supine to Sit;Sit to Supine     Supine to sit: Min assist Sit to supine: Min assist   General bed mobility comments: Pt able to move briefly into partial sitting with assist for Lt LE, but immediately returned to supine due to 10/10 pain.   Transfers Overall transfer level: Needs assistance               General transfer comment: Unable to perform secondary to pain limitations    Balance                                           ADL either performed or assessed with clinical judgement   ADL Overall ADL's : Needs assistance/impaired Eating/Feeding: Independent   Grooming: Wash/dry hands;Wash/dry face;Oral care;Brushing hair;Set up;Bed level    Upper Body Bathing: Minimal assistance;Bed level   Lower Body Bathing: Maximal assistance;Bed level   Upper Body Dressing : Maximal assistance;Bed level   Lower Body Dressing: Total assistance;Bed level   Toilet Transfer: Total assistance Toilet Transfer Details (indicate cue type and reason): unable  Toileting- Clothing Manipulation and Hygiene: Total assistance;Bed level       Functional mobility during ADLs: Total assistance(unable to tolerate moving to EOB due to pain ) General ADL Comments: Pt limited by severe pain Lt ankle with attempts to move to EOB      Vision         Perception     Praxis      Pertinent Vitals/Pain Pain Assessment: 0-10 Pain Score: 10-Worst pain ever Pain Location: foot with movement Pain Descriptors / Indicators: Aching;Sore;Burning;Radiating;Shooting Pain Intervention(s): Monitored during session;Repositioned;Limited activity within patient's tolerance;Relaxation     Hand Dominance Left   Extremity/Trunk Assessment Upper Extremity Assessment Upper Extremity Assessment: Overall WFL for tasks assessed   Lower Extremity Assessment Lower Extremity Assessment: Defer to PT evaluation   Cervical / Trunk Assessment Cervical / Trunk Assessment: Normal   Communication Communication Communication: No difficulties   Cognition Arousal/Alertness: Awake/alert Behavior During Therapy: WFL for tasks assessed/performed;Anxious Overall Cognitive Status: Within Functional Limits for tasks assessed  General Comments  Pt encouraged to deep breathe and in relaxation techniques to assist with pain management     Exercises     Shoulder Instructions      Home Living Family/patient expects to be discharged to:: Private residence Living Arrangements: Alone Available Help at Discharge: Available PRN/intermittently Type of Home: Apartment Home Access: Elevator Entrance Stairs-Number of Steps:  second level   Home Layout: One level     Bathroom Shower/Tub: Occupational psychologist: Handicapped height     Home Equipment: Civil engineer, contracting - built in   Additional Comments: lives in senior apartments      Prior Functioning/Environment Level of Independence: Independent        Comments: drives, fully independent        OT Problem List: Decreased activity tolerance;Impaired balance (sitting and/or standing);Decreased knowledge of use of DME or AE;Pain      OT Treatment/Interventions: Self-care/ADL training;DME and/or AE instruction;Therapeutic activities;Patient/family education;Balance training    OT Goals(Current goals can be found in the care plan section) Acute Rehab OT Goals Patient Stated Goal: to be able to move and take care of self  OT Goal Formulation: With patient Time For Goal Achievement: 10/06/19 Potential to Achieve Goals: Good ADL Goals Pt Will Perform Grooming: (P) with set-up;sitting Pt Will Perform Upper Body Bathing: (P) with set-up;sitting Pt Will Perform Lower Body Bathing: (P) with mod assist;with adaptive equipment;sit to/from stand Pt Will Perform Upper Body Dressing: (P) with supervision;with set-up;sitting Pt Will Perform Lower Body Dressing: (P) with mod assist;sit to/from stand Pt Will Transfer to Toilet: (P) with mod assist;stand pivot transfer;bedside commode Pt Will Perform Toileting - Clothing Manipulation and hygiene: (P) with mod assist;sit to/from stand  OT Frequency: Min 2X/week   Barriers to D/C: Decreased caregiver support          Co-evaluation              AM-PAC OT "6 Clicks" Daily Activity     Outcome Measure Help from another person eating meals?: None Help from another person taking care of personal grooming?: None Help from another person toileting, which includes using toliet, bedpan, or urinal?: Total Help from another person bathing (including washing, rinsing, drying)?: A Little Help from another  person to put on and taking off regular upper body clothing?: Total Help from another person to put on and taking off regular lower body clothing?: Total 6 Click Score: 14   End of Session Nurse Communication: Mobility status  Activity Tolerance: Patient limited by pain Patient left: in bed;with call bell/phone within reach;with bed alarm set;with nursing/sitter in room  OT Visit Diagnosis: Pain Pain - Right/Left: Left Pain - part of body: Ankle and joints of foot                Time: [id number] OT Time Calculation (min): 21 min Charges:  OT General Charges $OT Visit: 1 Visit OT Evaluation $OT Eval Moderate Complexity: 1 Mod  Nilsa Nutting., OTR/L Acute Rehabilitation Services Pager [phone] Office [phone]   Lucille Passy M 09/22/2019, 6:14 PM
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
## 2019-09-22 NOTE — Progress Notes (Addendum)
Reviewed photos with derm- they do not feel that this is sclerosing paniculitis.  Await her Cx. AFB stain pending, and fungal stains (-). Gram stain (-).  Will restart her cefepime/dapto.  Spoke with path- they don't have path results.  She can be d/c when ready, f/u with Dr Wilhemina Bonito at [place].  F/u with me in ID clinic.  availalble as needed.   Diagnosis: Cellulitis, osteomyelitis  Culture Result: pending  Allergies  Allergen Reactions  . Diphenhydramine Hcl Other (See Comments)    Given this to counteract an allergic reaction and condition worsened  . Other Other (See Comments)    States she had a reaction to a "gel" given to her after eye surgery.  . Sulfamethoxazole-Trimethoprim Other (See Comments)    Patient is not sure if allergy to this or contrast dye because they were given the same day, patient had a hard time breathing. Given Benadryl to counteract and condition worsened    OPAT Orders Discharge antibiotics: cefepime/daptomycin Per pharmacy protocol: daptomycin Cefepime  Duration: 28 days  End Date: 10-14-19  [facility] Care Per Protocol:  Home health RN for IV administration and teaching; PICC line care and labs.    Labs weekly while on IV antibiotics: _x_ CBC with differential __ BMP _x_ CMP _x_ CRP _x_ ESR __ Vancomycin trough _x_ CK  _x_ Please pull PIC at completion of IV antibiotics __ Please leave PIC in place until doctor has seen patient or been notified  Fax weekly labs to [phone]  Clinic Follow Up Appt: Larri Yehle 3 weeks

## 2019-09-22 NOTE — Plan of Care (Signed)
  Problem: Education: Goal: Knowledge of General Education information will improve Description: Including pain rating scale, medication(s)/side effects and non-pharmacologic comfort measures Outcome: Progressing   Problem: Activity: Goal: Risk for activity intolerance will decrease Outcome: Progressing   Problem: Pain Managment: Goal: General experience of comfort will improve Outcome: Progressing   Problem: Safety: Goal: Ability to remain free from injury will improve Outcome: Progressing   Problem: Skin Integrity: Goal: Risk for impaired skin integrity will decrease Outcome: Progressing   Problem: Education: Goal: Knowledge of General Education information will improve Description: Including pain rating scale, medication(s)/side effects and non-pharmacologic comfort measures Outcome: Progressing   Problem: Activity: Goal: Risk for activity intolerance will decrease Outcome: Progressing   Problem: Pain Managment: Goal: General experience of comfort will improve Outcome: Progressing   Problem: Safety: Goal: Ability to remain free from injury will improve Outcome: Progressing   Problem: Skin Integrity: Goal: Risk for impaired skin integrity will decrease Outcome: Progressing

## 2019-09-22 NOTE — Care Management Important Message (Signed)
Important Message  Patient Details  Name: Rebecca Pittman MRN: [id number] Date of Birth: 07/19/1940   Medicare Important Message Given:  Yes     Memory Argue 09/22/2019, 3:06 PM

## 2019-09-22 NOTE — NC FL2 (Signed)
[place] LEVEL OF CARE SCREENING TOOL     IDENTIFICATION  Patient Name: Rebecca Pittman Birthdate: July 02, 1940 Sex: female Admission Date (Current Location): 09/16/2019  [facility] and [place] Number:  [employer] and Address:  The . [facility], [place] [address], [place], [place] [address]      Provider Number: [id number]  Attending Physician Name and Address:  Elodia Florence., *  Relative Name and Phone Number:  Vonita Moss, Daughter [phone]    Current Level of Care: Hospital Recommended Level of Care: [place] Prior Approval Number:    Date Approved/Denied: 09/22/19 PASRR Number: [id number]  Discharge Plan: SNF    Current Diagnoses: Patient Active Problem List   Diagnosis Date Noted  . Subacute osteomyelitis, left ankle and foot ([place])   . Hyperlipidemia 08/03/2019  . Chest pain, unspecified 08/03/2019  . Left leg swelling 01/13/2019  . Sprain of wrist 01/13/2019  . Cellulitis of left ankle 12/07/2018  . Pain and swelling of left ankle 11/29/2018  . Ecchymosis 04/09/2018  . Headache 04/09/2018  . Hematoma of left chest wall 04/09/2018  . Laceration of left lower extremity 04/09/2018  . Right thigh pain 04/09/2018  . Fracture of multiple ribs of left side 04/08/2018  . Spider bite 04/30/2017  . Dermatochalasis 07/10/2011    Orientation RESPIRATION BLADDER Height & Weight     Self, Time, Situation, Place  Normal Continent Weight: 67.1 kg Height:  5\' 2"  (157.5 cm)  BEHAVIORAL SYMPTOMS/MOOD NEUROLOGICAL BOWEL NUTRITION STATUS      Continent (see discharge summary)  AMBULATORY STATUS COMMUNICATION OF NEEDS Skin   Extensive Assist Verbally Surgical wounds, Wound Vac                       Personal Care Assistance Level of Assistance  Dressing, Bathing Bathing Assistance: Limited assistance   Dressing Assistance: Limited assistance     Functional Limitations Info             SPECIAL  CARE FACTORS FREQUENCY  PT (By licensed PT), OT (By licensed OT)     PT Frequency: 5 times per week OT Frequency: 5 times per week            Contractures Contractures Info: Not present    Additional Factors Info  Code Status Code Status Info: partial code             Current Medications (09/22/2019):  This is the current hospital active medication list Current Facility-Administered Medications  Medication Dose Route Frequency Provider Last Rate Last Admin  . 0.9 %  sodium chloride infusion   Intravenous Continuous Persons, Bevely Palmer, [place] 75 mL/hr at 09/21/19 1449 New Bag at 09/21/19 1449  . acetaminophen (TYLENOL) tablet 1,000 mg  1,000 mg Oral Q8H Elodia Florence., MD   1,000 mg at 09/22/19 1612  . ceFEPIme (MAXIPIME) 2 g in sodium chloride 0.9 % 100 mL IVPB  2 g Intravenous Q12H Elodia Florence., MD 200 mL/hr at 09/22/19 1129 2 g at 09/22/19 1129  . cholecalciferol (VITAMIN D3) tablet 1,000 Units  1,000 Units Oral Daily Persons, Bevely Palmer, PA   1,000 Units at 09/22/19 1008  . DAPTOmycin (CUBICIN) 500 mg in sodium chloride 0.9 % IVPB  500 mg Intravenous Q24H Claudina Lick, Student-PharmD      . docusate sodium (COLACE) capsule 100 mg  100 mg Oral BID Persons, Bevely Palmer, PA   100 mg at  09/22/19 1008  . enoxaparin (LOVENOX) injection 40 mg  40 mg Subcutaneous Q24H Persons, Bevely Palmer, PA   40 mg at 09/21/19 2202  . HYDROmorphone (DILAUDID) injection 0.5 mg  0.5 mg Intravenous Q4H PRN Persons, Bevely Palmer, PA   0.5 mg at 09/22/19 1245  . ibuprofen (ADVIL) tablet 400 mg  400 mg Oral Q4H PRN Persons, Bevely Palmer, PA   400 mg at 09/22/19 1239  . lactated ringers infusion   Intravenous Continuous Persons, Bevely Palmer, [place] 30 mL/hr at 09/21/19 [id number] Rate Verify at 09/21/19 0607  . lactated ringers infusion   Intravenous Continuous Persons, Bevely Palmer, [place] 500 mL/hr at 09/21/19 1205 Rate Change at 09/21/19 1205  . ondansetron (ZOFRAN) tablet 4 mg  4 mg Oral Q6H PRN Persons, Bevely Palmer, PA        Or  . ondansetron [facility]) injection 4 mg  4 mg Intravenous Q6H PRN Persons, Bevely Palmer, PA   4 mg at 09/21/19 1903  . oxyCODONE (Oxy IR/ROXICODONE) immediate release tablet 5-10 mg  5-10 mg Oral Q4H PRN Persons, Bevely Palmer, PA   5 mg at 09/22/19 1617  . saccharomyces boulardii (FLORASTOR) capsule 250 mg  250 mg Oral Q48H PRN Persons, Bevely Palmer, PA      . vitamin B-12 (CYANOCOBALAMIN) tablet 1,000 mcg  1,000 mcg Oral Daily Persons, Bevely Palmer, PA   1,000 mcg at 09/22/19 1008     Discharge Medications: Please see discharge summary for a list of discharge medications.  Relevant Imaging Results:  Relevant Lab Results:   Additional Information SS # [id number]  Curlene Labrum, RN

## 2019-09-23 ENCOUNTER — Inpatient Hospital Stay: Payer: Self-pay

## 2019-09-23 LAB — PHOSPHORUS: Phosphorus: 2.6 mg/dL (ref 2.5–4.6)

## 2019-09-23 LAB — COMPREHENSIVE METABOLIC PANEL
ALT: 14 U/L (ref 0–44)
AST: 28 U/L (ref 15–41)
Albumin: 2.6 g/dL — ABNORMAL LOW (ref 3.5–5.0)
Alkaline Phosphatase: 55 U/L (ref 38–126)
Anion gap: 8 (ref 5–15)
BUN: 14 mg/dL (ref 8–23)
CO2: 25 mmol/L (ref 22–32)
Calcium: 8.2 mg/dL — ABNORMAL LOW (ref 8.9–10.3)
Chloride: 108 mmol/L (ref 98–111)
Creatinine, Ser: 0.82 mg/dL (ref 0.44–1.00)
GFR calc Af Amer: >60 mL/min (ref >60)
GFR calc non Af Amer: >60 mL/min (ref >60)
Glucose, Bld: 99 mg/dL (ref 70–99)
Potassium: 4.2 mmol/L (ref 3.5–5.1)
Sodium: 141 mmol/L (ref 135–145)
Total Bilirubin: 0.7 mg/dL (ref 0.3–1.2)
Total Protein: 5.3 g/dL — ABNORMAL LOW (ref 6.5–8.1)

## 2019-09-23 LAB — CBC WITH DIFFERENTIAL/PLATELET
Abs Immature Granulocytes: 0.03 10*3/uL (ref 0.00–0.07)
Basophils Absolute: 0.1 10*3/uL (ref 0.0–0.1)
Basophils Relative: 1 %
Eosinophils Absolute: 0.4 10*3/uL (ref 0.0–0.5)
Eosinophils Relative: 6 %
HCT: 30.7 % — ABNORMAL LOW (ref 36.0–46.0)
Hemoglobin: 9.8 g/dL — ABNORMAL LOW (ref 12.0–15.0)
Immature Granulocytes: 0 %
Lymphocytes Relative: 20 %
Lymphs Abs: 1.4 10*3/uL (ref 0.7–4.0)
MCH: 30.6 pg (ref 26.0–34.0)
MCHC: 31.9 g/dL (ref 30.0–36.0)
MCV: 95.9 fL (ref 80.0–100.0)
Monocytes Absolute: 0.7 10*3/uL (ref 0.1–1.0)
Monocytes Relative: 10 %
Neutro Abs: 4.4 10*3/uL (ref 1.7–7.7)
Neutrophils Relative %: 63 %
Platelets: 228 10*3/uL (ref 150–400)
RBC: 3.2 MIL/uL — ABNORMAL LOW (ref 3.87–5.11)
RDW: 13.2 % (ref 11.5–15.5)
WBC: 7 10*3/uL (ref 4.0–10.5)
nRBC: 0 % (ref 0.0–0.2)

## 2019-09-23 LAB — MAGNESIUM: Magnesium: 2 mg/dL (ref 1.7–2.4)

## 2019-09-23 MED ORDER — POLYETHYLENE GLYCOL 3350 17 G PO PACK
17.0000 g | PACK | Freq: Two times a day (BID) | ORAL | Status: DC
  Administered 2019-09-23 – 2019-09-24 (×4): 17 g via ORAL
  Filled 2019-09-23 (×9): qty 1

## 2019-09-23 NOTE — Progress Notes (Signed)
Comfortable alert and awake. Dressing intact. Vac functioning. CX thus far negative. Will need 1 week follow up with Dr. [name] to remove vac

## 2019-09-23 NOTE — Progress Notes (Signed)
Physical Therapy Treatment Patient Details Name: Rebecca Pittman MRN: [id number] DOB: 11/06/1940 Today's Date: 09/23/2019    History of Present Illness Pt is a 79 y/o female admitted for cellulitis of L lateral ankle, now s/p I&D and partial excision of fibula. No pertinent PMH.    PT Comments    Pt making steady progress with overall functional mobility as compared to yesterday. She was able to perform transfers with RW and min A. She continues to have significant pain in L foot/ankle and is unable to tolerate any WB'ing. Pt would continue to benefit from skilled physical therapy services at this time while admitted and after d/c to address the below listed limitations in order to improve overall safety and independence with functional mobility.    Follow Up Recommendations  SNF     Equipment Recommendations  Other (comment)(defer to next venue of care)    Recommendations for Other Services       Precautions / Restrictions Precautions Precautions: Other (comment) Precaution Comments: wound VAC Restrictions Weight Bearing Restrictions: Yes RLE Weight Bearing: Weight bearing as tolerated    Mobility  Bed Mobility               General bed mobility comments: pt seated EOB upon arrival  Transfers Overall transfer level: Needs assistance Equipment used: Rolling walker (2 wheeled) Transfers: Sit to/from [facility] Sit to Stand: Min assist Stand pivot transfers: Min assist       General transfer comment: cueing for safe hand placement, assistance for stability with transitional movement. Pt basically maintaining [place] L LE despite cueing and education that she is able to [facility]  Ambulation/Gait             General Gait Details: unable at this time; limited hopping on R LE for transfers   Stairs             Wheelchair Mobility    Modified Rankin (Stroke Patients Only)       Balance Overall balance assessment: Needs  assistance Sitting-balance support: Feet supported Sitting balance-Leahy Scale: Good     Standing balance support: Bilateral upper extremity supported;Single extremity supported Standing balance-Leahy Scale: Poor                              Cognition Arousal/Alertness: Awake/alert Behavior During Therapy: WFL for tasks assessed/performed Overall Cognitive Status: Within Functional Limits for tasks assessed                                        Exercises      General Comments        Pertinent Vitals/Pain Pain Assessment: Faces Faces Pain Scale: Hurts a little bit Pain Location: L foot Pain Descriptors / Indicators: Sore Pain Intervention(s): Monitored during session;Repositioned;Premedicated before session    Home Living                      Prior Function            PT Goals (current goals can now be found in the care plan section) Acute Rehab PT Goals PT Goal Formulation: With patient Progress towards PT goals: Progressing toward goals    Frequency    Min 3X/week      PT Plan Current plan remains appropriate    Co-evaluation  AM-PAC PT "6 Clicks" Mobility   Outcome Measure  Help needed turning from your back to your side while in a flat bed without using bedrails?: None Help needed moving from lying on your back to sitting on the side of a flat bed without using bedrails?: None Help needed moving to and from a bed to a chair (including a wheelchair)?: A Little Help needed standing up from a chair using your arms (e.g., wheelchair or bedside chair)?: A Little Help needed to walk in hospital room?: Total Help needed climbing 3-5 steps with a railing? : Total 6 Click Score: 16    End of Session Equipment Utilized During Treatment: Gait belt Activity Tolerance: Patient tolerated treatment well Patient left: in chair;with call bell/phone within reach;with chair alarm set Nurse Communication:  Mobility status PT Visit Diagnosis: Unsteadiness on feet (R26.81);Muscle weakness (generalized) (M62.81);Difficulty in walking, not elsewhere classified (R26.2);Pain Pain - Right/Left: Left Pain - part of body: Ankle and joints of foot     Time: [id number] PT Time Calculation (min) (ACUTE ONLY): 23 min  Charges:  $Therapeutic Activity: 23-37 mins                     Anastasio Champion, DPT  Acute Rehabilitation Services Pager [phone] Office [place] 09/23/2019, 1:36 PM

## 2019-09-23 NOTE — TOC Progression Note (Signed)
Transition of Care [facility]) - Progression Note    Patient Details  Name: Rebecca Pittman MRN: [id number] Date of Birth: Jul 24, 1940  Transition of Care [facility]) CM/SW [place], RN Phone Number: 09/23/2019, 4:11 PM  Clinical Narrative:   Case management spoke with the patient and gave her a list of the accepting facilities for possible discharge for Monday 3/27 - Patient's first choice is [facility] and next [facility] in [place].  Called [facility]F and they will not be able to provide services due to the expense of the antibiotics.  Waiting to hear back from [facility] but will need to followup with other SNF facilities concerning cost of medications and acceptance.       Expected Discharge Plan: [place] Barriers to Discharge: No Barriers Identified  Expected Discharge Plan and Services Expected Discharge Plan: [place]   Discharge Planning Services: CM Consult Post Acute Care Choice: Durable Medical Equipment Living arrangements for the past 2 months: Apartment                                       Social Determinants of Health (SDOH) Interventions    Readmission Risk Interventions No flowsheet data found.

## 2019-09-23 NOTE — Progress Notes (Signed)
Occupational Therapy Treatment Patient Details Name: Rebecca Pittman MRN: [id number] DOB: 09/01/1940 Today's Date: 09/23/2019    History of present illness Pt is a 79 y/o female admitted for cellulitis of L lateral ankle, now s/p I&D and partial excision of fibula. No pertinent PMH.   OT comments  Pt is significantly improved compared to yesterday, but requires min - mod A for ADLs.  She is limited by pain.  She lives alone with limited support at discharge.  Continue to recommend SNF level rehab.    Follow Up Recommendations  SNF    Equipment Recommendations  None recommended by OT    Recommendations for Other Services      Precautions / Restrictions Precautions Precautions: Other (comment) Precaution Comments: wound VAC Restrictions Weight Bearing Restrictions: Yes RLE Weight Bearing: Weight bearing as tolerated       Mobility Bed Mobility               General bed mobility comments: Pt sitting in recliner   Transfers Overall transfer level: Needs assistance Equipment used: Rolling walker (2 wheeled) Transfers: Sit to/from [facility] Sit to Stand: Min assist Stand pivot transfers: Min assist       General transfer comment: min A to steady.  Limited by pain     Balance Overall balance assessment: Needs assistance Sitting-balance support: Feet supported Sitting balance-Leahy Scale: Good     Standing balance support: Bilateral upper extremity supported;Single extremity supported Standing balance-Leahy Scale: Poor Standing balance comment: reliant on UE support                            ADL either performed or assessed with clinical judgement   ADL Overall ADL's : Needs assistance/impaired                     Lower Body Dressing: Maximal assistance;Sit to/from stand Lower Body Dressing Details (indicate cue type and reason): Pt able to simulate donning pants with mod A - she is able to thread pants over feet, but  requires assist to pull them over her hips  Toilet Transfer: Minimal assistance;Stand-pivot;BSC;RW   Toileting- Clothing Manipulation and Hygiene: Maximal assistance;Sit to/from stand       Functional mobility during ADLs: Minimal assistance;Rolling walker       Vision       Perception     Praxis      Cognition Arousal/Alertness: Awake/alert Behavior During Therapy: WFL for tasks assessed/performed Overall Cognitive Status: Within Functional Limits for tasks assessed                                          Exercises     Shoulder Instructions       General Comments Pt initially stating she was now planning to discharge home.  After discussion re: home activities and limited support, she agreed that she will need SNF level rehab     Pertinent Vitals/ Pain       Pain Assessment: Faces Faces Pain Scale: Hurts even more Pain Location: L foot Pain Descriptors / Indicators: Sore;Radiating;Stabbing;Grimacing;Guarding Pain Intervention(s): Monitored during session;Repositioned  Home Living  Prior Functioning/Environment              Frequency  Min 2X/week        Progress Toward Goals  OT Goals(current goals can now be found in the care plan section)  Progress towards OT goals: Progressing toward goals     Plan Discharge plan remains appropriate    Co-evaluation                 AM-PAC OT "6 Clicks" Daily Activity     Outcome Measure   Help from another person eating meals?: None Help from another person taking care of personal grooming?: None Help from another person toileting, which includes using toliet, bedpan, or urinal?: A Lot Help from another person bathing (including washing, rinsing, drying)?: A Little Help from another person to put on and taking off regular upper body clothing?: A Little Help from another person to put on and taking off regular lower body  clothing?: A Lot 6 Click Score: 18    End of Session Equipment Utilized During Treatment: Gait belt;Rolling walker  OT Visit Diagnosis: Pain Pain - Right/Left: Left Pain - part of body: Ankle and joints of foot   Activity Tolerance Patient limited by pain   Patient Left in chair;with call bell/phone within reach;with chair alarm set   Nurse Communication Mobility status        Time: [id number] OT Time Calculation (min): 18 min  Charges: OT General Charges $OT Visit: 1 Visit OT Treatments $Self Care/Home Management : 8-22 mins  Nilsa Nutting OTR/L Acute Rehabilitation Services Pager [phone] Office [phone]    Lucille Passy M 09/23/2019, 3:01 PM
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
## 2019-09-24 LAB — CBC WITH DIFFERENTIAL/PLATELET
Abs Immature Granulocytes: 0.03 10*3/uL (ref 0.00–0.07)
Basophils Absolute: 0.1 10*3/uL (ref 0.0–0.1)
Basophils Relative: 1 %
Eosinophils Absolute: 0.3 10*3/uL (ref 0.0–0.5)
Eosinophils Relative: 5 %
HCT: 28.7 % — ABNORMAL LOW (ref 36.0–46.0)
Hemoglobin: 9 g/dL — ABNORMAL LOW (ref 12.0–15.0)
Immature Granulocytes: 0 %
Lymphocytes Relative: 21 %
Lymphs Abs: 1.5 10*3/uL (ref 0.7–4.0)
MCH: 30 pg (ref 26.0–34.0)
MCHC: 31.4 g/dL (ref 30.0–36.0)
MCV: 95.7 fL (ref 80.0–100.0)
Monocytes Absolute: 0.8 10*3/uL (ref 0.1–1.0)
Monocytes Relative: 11 %
Neutro Abs: 4.6 10*3/uL (ref 1.7–7.7)
Neutrophils Relative %: 62 %
Platelets: 233 10*3/uL (ref 150–400)
RBC: 3 MIL/uL — ABNORMAL LOW (ref 3.87–5.11)
RDW: 13.3 % (ref 11.5–15.5)
WBC: 7.4 10*3/uL (ref 4.0–10.5)
nRBC: 0 % (ref 0.0–0.2)

## 2019-09-24 LAB — COMPREHENSIVE METABOLIC PANEL
ALT: 25 U/L (ref 0–44)
AST: 34 U/L (ref 15–41)
Albumin: 2.4 g/dL — ABNORMAL LOW (ref 3.5–5.0)
Alkaline Phosphatase: 56 U/L (ref 38–126)
Anion gap: 7 (ref 5–15)
BUN: 13 mg/dL (ref 8–23)
CO2: 25 mmol/L (ref 22–32)
Calcium: 8.4 mg/dL — ABNORMAL LOW (ref 8.9–10.3)
Chloride: 111 mmol/L (ref 98–111)
Creatinine, Ser: 0.76 mg/dL (ref 0.44–1.00)
GFR calc Af Amer: >60 mL/min (ref >60)
GFR calc non Af Amer: >60 mL/min (ref >60)
Glucose, Bld: 91 mg/dL (ref 70–99)
Potassium: 4.4 mmol/L (ref 3.5–5.1)
Sodium: 143 mmol/L (ref 135–145)
Total Bilirubin: 0.8 mg/dL (ref 0.3–1.2)
Total Protein: 5.1 g/dL — ABNORMAL LOW (ref 6.5–8.1)

## 2019-09-24 LAB — MAGNESIUM: Magnesium: 2 mg/dL (ref 1.7–2.4)

## 2019-09-24 LAB — PHOSPHORUS: Phosphorus: 3 mg/dL (ref 2.5–4.6)

## 2019-09-24 MED ORDER — CHLORHEXIDINE GLUCONATE CLOTH 2 % EX PADS
6.0000 | MEDICATED_PAD | Freq: Every day | CUTANEOUS | Status: DC
  Administered 2019-09-25 – 2019-09-29 (×4): 6 via TOPICAL

## 2019-09-24 MED ORDER — SODIUM CHLORIDE 0.9% FLUSH
10.0000 mL | INTRAVENOUS | Status: DC | PRN
  Administered 2019-09-29: 10 mL
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
## 2019-09-24 NOTE — Progress Notes (Signed)
Physical Therapy Treatment Patient Details Name: Rebecca Pittman MRN: [id number] DOB: May 07, 1941 Today's Date: 09/24/2019    History of Present Illness Pt is a 79 y/o female admitted for cellulitis of L lateral ankle, now s/p I&D and partial excision of fibula. No pertinent PMH.    PT Comments    Pt making steady progress with functional mobility as indicated by her ability to tolerate short distance gait training this session. She was able to accept more weight through her L LE as well. Continue to recommend SNF for short-term rehab prior to returning home alone. Pt would continue to benefit from skilled physical therapy services at this time while admitted and after d/c to address the below listed limitations in order to improve overall safety and independence with functional mobility.   Follow Up Recommendations  SNF     Equipment Recommendations  Other (comment)(defer to next venue of care)    Recommendations for Other Services       Precautions / Restrictions Precautions Precautions: Other (comment) Precaution Comments: wound VAC Restrictions Weight Bearing Restrictions: Yes RLE Weight Bearing: Weight bearing as tolerated    Mobility  Bed Mobility Overal bed mobility: Needs Assistance Bed Mobility: Supine to Sit     Supine to sit: Min guard     General bed mobility comments: no physical assistance needed  Transfers Overall transfer level: Needs assistance Equipment used: Rolling walker (2 wheeled) Transfers: Sit to/from Stand Sit to Stand: Min guard         General transfer comment: min guard for safety, cueing for safe hand placement  Ambulation/Gait Ambulation/Gait assistance: Min guard Gait Distance (Feet): 20 Feet Assistive device: Rolling walker (2 wheeled) Gait Pattern/deviations: Step-through pattern;Decreased stride length;Decreased weight shift to left Gait velocity: decreased   General Gait Details: pt able to tolerate partial WB'ing through R  LE this session and progressing to gait training with min guard and cueing for sequencing with RW   Stairs             Wheelchair Mobility    Modified Rankin (Stroke Patients Only)       Balance Overall balance assessment: Needs assistance Sitting-balance support: Feet supported Sitting balance-Leahy Scale: Good     Standing balance support: Bilateral upper extremity supported;Single extremity supported Standing balance-Leahy Scale: Poor                              Cognition Arousal/Alertness: Awake/alert Behavior During Therapy: WFL for tasks assessed/performed Overall Cognitive Status: Within Functional Limits for tasks assessed                                        Exercises General Exercises - Lower Extremity Ankle Circles/Pumps: AROM;Left;10 reps;Seated    General Comments        Pertinent Vitals/Pain Pain Assessment: Faces Faces Pain Scale: Hurts little more Pain Location: L foot Pain Descriptors / Indicators: Guarding;Discomfort Pain Intervention(s): Monitored during session;Repositioned;Patient requesting pain meds-RN notified;RN gave pain meds during session    Home Living                      Prior Function            PT Goals (current goals can now be found in the care plan section) Acute Rehab PT Goals PT Goal Formulation: With patient Progress  towards PT goals: Progressing toward goals    Frequency    Min 3X/week      PT Plan Current plan remains appropriate    Co-evaluation              AM-PAC PT "6 Clicks" Mobility   Outcome Measure  Help needed turning from your back to your side while in a flat bed without using bedrails?: None Help needed moving from lying on your back to sitting on the side of a flat bed without using bedrails?: None Help needed moving to and from a bed to a chair (including a wheelchair)?: A Little Help needed standing up from a chair using your arms (e.g.,  wheelchair or bedside chair)?: A Little Help needed to walk in hospital room?: A Little Help needed climbing 3-5 steps with a railing? : A Lot 6 Click Score: 19    End of Session Equipment Utilized During Treatment: Gait belt Activity Tolerance: Patient tolerated treatment well Patient left: in chair;with call bell/phone within reach;with chair alarm set Nurse Communication: Mobility status PT Visit Diagnosis: Unsteadiness on feet (R26.81);Muscle weakness (generalized) (M62.81);Difficulty in walking, not elsewhere classified (R26.2);Pain Pain - Right/Left: Left Pain - part of body: Ankle and joints of foot     Time: 1010-1037 PT Time Calculation (min) (ACUTE ONLY): 27 min  Charges:  $Gait Training: 8-22 mins $Therapeutic Activity: 8-22 mins                     Anastasio Champion, DPT  Acute Rehabilitation Services Pager [phone] Office [place] 09/24/2019, 12:38 PM

## 2019-09-24 NOTE — Progress Notes (Signed)
Peripherally Inserted Central Catheter Placement  The IV Nurse has discussed with the patient and/or persons authorized to consent for the patient, the purpose of this procedure and the potential benefits and risks involved with this procedure.  The benefits include less needle sticks, lab draws from the catheter, and the patient may be discharged home with the catheter. Risks include, but not limited to, infection, bleeding, blood clot (thrombus formation), and puncture of an artery; nerve damage and irregular heartbeat and possibility to perform a PICC exchange if needed/ordered by physician.  Alternatives to this procedure were also discussed.  Bard Power PICC patient education guide, fact sheet on infection prevention and patient information card has been provided to patient /or left at bedside.    PICC Placement Documentation  PICC Single Lumen [id number] Right Basilic 36 cm 1 cm (Active)  Indication for Insertion or Continuance of Line Home intravenous therapies (PICC only) 09/24/19 1200  Exposed Catheter (cm) 1 cm 09/24/19 1200  Site Assessment Clean;Dry;Intact 09/24/19 1200  Line Status Flushed;Blood return noted;Saline locked 09/24/19 1200  Dressing Type Transparent 09/24/19 1200  Dressing Status Clean;Dry;Intact;Antimicrobial disc in place 09/24/19 Terminous checked and tightened 09/24/19 1200  Line Adjustment (NICU/IV Team Only) No 09/24/19 1200  Dressing Intervention New dressing 09/24/19 1200  Dressing Change Due 10/01/19 09/24/19 1200       Lorenza Cambridge 09/24/2019, 12:05 PM

## 2019-09-25 DIAGNOSIS — L539 Erythematous condition, unspecified: Secondary | ICD-10-CM

## 2019-09-25 LAB — CBC WITH DIFFERENTIAL/PLATELET
Abs Immature Granulocytes: 0.03 10*3/uL (ref 0.00–0.07)
Basophils Absolute: 0 10*3/uL (ref 0.0–0.1)
Basophils Relative: 1 %
Eosinophils Absolute: 0.4 10*3/uL (ref 0.0–0.5)
Eosinophils Relative: 7 %
HCT: 27.9 % — ABNORMAL LOW (ref 36.0–46.0)
Hemoglobin: 8.9 g/dL — ABNORMAL LOW (ref 12.0–15.0)
Immature Granulocytes: 1 %
Lymphocytes Relative: 24 %
Lymphs Abs: 1.5 10*3/uL (ref 0.7–4.0)
MCH: 30.7 pg (ref 26.0–34.0)
MCHC: 31.9 g/dL (ref 30.0–36.0)
MCV: 96.2 fL (ref 80.0–100.0)
Monocytes Absolute: 0.7 10*3/uL (ref 0.1–1.0)
Monocytes Relative: 11 %
Neutro Abs: 3.5 10*3/uL (ref 1.7–7.7)
Neutrophils Relative %: 56 %
Platelets: 241 10*3/uL (ref 150–400)
RBC: 2.9 MIL/uL — ABNORMAL LOW (ref 3.87–5.11)
RDW: 13.3 % (ref 11.5–15.5)
WBC: 6.2 10*3/uL (ref 4.0–10.5)
nRBC: 0 % (ref 0.0–0.2)

## 2019-09-25 LAB — COMPREHENSIVE METABOLIC PANEL
ALT: 24 U/L (ref 0–44)
AST: 27 U/L (ref 15–41)
Albumin: 2.3 g/dL — ABNORMAL LOW (ref 3.5–5.0)
Alkaline Phosphatase: 53 U/L (ref 38–126)
Anion gap: 6 (ref 5–15)
BUN: 13 mg/dL (ref 8–23)
CO2: 24 mmol/L (ref 22–32)
Calcium: 8.2 mg/dL — ABNORMAL LOW (ref 8.9–10.3)
Chloride: 112 mmol/L — ABNORMAL HIGH (ref 98–111)
Creatinine, Ser: 0.64 mg/dL (ref 0.44–1.00)
GFR calc Af Amer: >60 mL/min (ref >60)
GFR calc non Af Amer: >60 mL/min (ref >60)
Glucose, Bld: 88 mg/dL (ref 70–99)
Potassium: 3.9 mmol/L (ref 3.5–5.1)
Sodium: 142 mmol/L (ref 135–145)
Total Bilirubin: 0.6 mg/dL (ref 0.3–1.2)
Total Protein: 5.1 g/dL — ABNORMAL LOW (ref 6.5–8.1)

## 2019-09-25 LAB — PHOSPHORUS: Phosphorus: 2.5 mg/dL (ref 2.5–4.6)

## 2019-09-25 LAB — MAGNESIUM: Magnesium: 2.1 mg/dL (ref 1.7–2.4)

## 2019-09-25 MED ORDER — VANCOMYCIN HCL 1250 MG/250ML IV SOLN
1250.0000 mg | INTRAVENOUS | Status: DC
  Administered 2019-09-25 – 2019-09-28 (×4): 1250 mg via INTRAVENOUS
  Filled 2019-09-25 (×5): qty 250

## 2019-09-25 NOTE — Progress Notes (Signed)
Subjective: No new complaints   Antibiotics:  Anti-infectives (From admission, onward)   Start     Dose/Rate Route Frequency Ordered Stop   09/25/19 1800  vancomycin (VANCOREADY) IVPB 1250 mg/250 mL     1,250 mg 166.7 mL/hr over 90 Minutes Intravenous Every 24 hours 09/25/19 1606     09/22/19 2000  DAPTOmycin (CUBICIN) 270 mg in sodium chloride 0.9 % IVPB  Status:  Discontinued     270 mg 210.8 mL/hr over 30 Minutes Intravenous Every 24 hours 09/22/19 1114 09/22/19 1147   09/22/19 2000  DAPTOmycin (CUBICIN) 500 mg in sodium chloride 0.9 % IVPB  Status:  Discontinued     500 mg 220 mL/hr over 30 Minutes Intravenous Every 24 hours 09/22/19 1147 09/25/19 1527   09/22/19 1200  ceFEPIme (MAXIPIME) 2 g in sodium chloride 0.9 % 100 mL IVPB     2 g 200 mL/hr over 30 Minutes Intravenous Every 12 hours 09/22/19 1114     09/21/19 1800  ceFAZolin (ANCEF) IVPB 1 g/50 mL premix     1 g 100 mL/hr over 30 Minutes Intravenous Every 6 hours 09/21/19 1432 09/22/19 0557   09/21/19 0600  ceFAZolin (ANCEF) IVPB 2g/100 mL premix     2 g 200 mL/hr over 30 Minutes Intravenous On call to O.R. 09/21/19 0419 09/21/19 1153   09/20/19 1000  fluconazole (DIFLUCAN) tablet 100 mg  Status:  Discontinued     100 mg Oral Daily 09/20/19 0844 09/20/19 0921   09/18/19 2000  DAPTOmycin (CUBICIN) 270 mg in sodium chloride 0.9 % IVPB  Status:  Discontinued     270 mg 210.8 mL/hr over 30 Minutes Intravenous Every 24 hours 09/17/19 1517 09/20/19 0921   09/17/19 1530  DAPTOmycin (CUBICIN) 270 mg in sodium chloride 0.9 % IVPB  Status:  Discontinued     270 mg 210.8 mL/hr over 30 Minutes Intravenous Daily 09/17/19 1517 09/17/19 1518   09/17/19 1530  DAPTOmycin (CUBICIN) 270 mg in sodium chloride 0.9 % IVPB     270 mg 210.8 mL/hr over 30 Minutes Intravenous  Once 09/17/19 1518 09/17/19 1632   09/16/19 1600  ceFEPIme (MAXIPIME) 2 g in sodium chloride 0.9 % 100 mL IVPB  Status:  Discontinued     2 g 200 mL/hr over  30 Minutes Intravenous Every 12 hours 09/16/19 1547 09/20/19 0921      Medications: Scheduled Meds: . Chlorhexidine Gluconate Cloth  6 each Topical Daily  . cholecalciferol  1,000 Units Oral Daily  . docusate sodium  100 mg Oral BID  . enoxaparin (LOVENOX) injection  40 mg Subcutaneous Q24H  . polyethylene glycol  17 g Oral BID  . vitamin B-12  1,000 mcg Oral Daily   Continuous Infusions: . sodium chloride 75 mL/hr at 09/24/19 1244  . ceFEPime (MAXIPIME) IV 2 g (09/25/19 1156)  . lactated ringers 30 mL/hr at 09/21/19 0607  . lactated ringers 500 mL/hr at 09/21/19 1205  . vancomycin     PRN Meds:.HYDROmorphone (DILAUDID) injection, ibuprofen, ondansetron **OR** ondansetron (ZOFRAN) IV, oxyCODONE, saccharomyces boulardii, sodium chloride flush    Objective: Weight change:   Intake/Output Summary (Last 24 hours) at 09/25/2019 1619 Last data filed at 09/25/2019 1300 Gross per 24 hour  Intake 480 ml  Output --  Net 480 ml   Blood pressure (!) 159/70, pulse 76, temperature 98.5 F (36.9 C), temperature source Oral, resp. rate 18, height '5\' 2"'$  (1.575 m), weight 67.1 kg, SpO2 93 %. Temp:  [  97.6 F (36.4 C)-98.5 F (36.9 C)] 98.5 F (36.9 C) (03/28 1336) Pulse Rate:  [69-90] 76 (03/28 1336) Resp:  [18] 18 (03/28 1336) BP: (143-176)/(66-70) 159/70 (03/28 1336) SpO2:  [93 %-99 %] 93 % (03/28 1336)  Physical Exam: General: Alert and awake, oriented x3, not in any acute distress. HEENT: anicteric sclera, EOMI CVS regular rate, normal  Chest: , no wheezing, no respiratory distress Abdomen: soft non-distended,  Extremities: no edema or deformity noted bilaterally Skin: leg wrapped Neuro: nonfocal  CBC:    BMET Recent Labs    09/24/19 0303 09/25/19 0416  NA 143 142  K 4.4 3.9  CL 111 112*  CO2 25 24  GLUCOSE 91 88  BUN 13 13  CREATININE 0.76 0.64  CALCIUM 8.4* 8.2*     Liver Panel  Recent Labs    09/24/19 0303 09/25/19 0416  PROT 5.1* 5.1*  ALBUMIN 2.4*  2.3*  AST 34 27  ALT 25 24  ALKPHOS 56 53  BILITOT 0.8 0.6       Sedimentation Rate No results for input(s): ESRSEDRATE in the last 72 hours. C-Reactive Protein No results for input(s): CRP in the last 72 hours.  Micro Results: Recent Results (from the past 720 hour(s))  Culture, blood (routine x 2)     Status: None   Collection Time: 09/17/19  3:27 PM   Specimen: BLOOD  Result Value Ref Range Status   Specimen Description   Final    BLOOD LEFT ANTECUBITAL Performed at [facility], [place] [address] [place], [place] [address]    Special Requests   Final    BOTTLES DRAWN AEROBIC AND ANAEROBIC Blood Culture adequate volume Performed at [facility] [address] [place], [place] [address]    Culture   Final    NO GROWTH 5 DAYS Performed at [facility], [place] [address] [place], [place] [address]    Report Status 09/22/2019 FINAL  Final  Culture, blood (routine x 2)     Status: None   Collection Time: 09/17/19  3:39 PM   Specimen: BLOOD  Result Value Ref Range Status   Specimen Description   Final    BLOOD LEFT ARM Performed at [facility] [address], [place], [place] [address]    Special Requests   Final    BOTTLES DRAWN AEROBIC AND ANAEROBIC Blood Culture adequate volume Performed at [facility] [address], [place], [place] [address]    Culture   Final    NO GROWTH 5 DAYS Performed at [facility], [place] [address], [place], [place] [address]    Report Status 09/22/2019 FINAL  Final  MRSA PCR Screening     Status: None   Collection Time: 09/18/19  6:30 PM   Specimen: Nasal Mucosa; Nasopharyngeal  Result Value Ref Range Status   MRSA by PCR NEGATIVE NEGATIVE Final    Comment:        The GeneXpert MRSA Assay (FDA approved for NASAL specimens only), is one component of a comprehensive MRSA colonization surveillance program. It is not intended to diagnose MRSA infection nor  to guide or monitor treatment for MRSA infections. Performed at [facility], [place] [address], [place], [place] [address]   Respiratory Panel by RT PCR (Flu A&B, Covid) - Nasopharyngeal Swab     Status: None   Collection Time: 09/21/19  9:05 AM   Specimen: Nasopharyngeal Swab  Result Value Ref  Range Status   SARS Coronavirus 2 by RT PCR NEGATIVE NEGATIVE Final    Comment: (NOTE) SARS-CoV-2 target nucleic acids are NOT DETECTED. The SARS-CoV-2 RNA is generally detectable in upper respiratoy specimens during the acute phase of infection. The lowest concentration of SARS-CoV-2 viral copies this assay can detect is 131 copies/mL. A negative result does not preclude SARS-Cov-2 infection and should not be used as the sole basis for treatment or other patient management decisions. A negative result may occur with  improper specimen collection/handling, submission of specimen other than nasopharyngeal swab, presence of viral mutation(s) within the areas targeted by this assay, and inadequate number of viral copies (<131 copies/mL). A negative result must be combined with clinical observations, patient history, and epidemiological information. The expected result is Negative. Fact Sheet for Patients:  PinkCheek.be Fact Sheet for Healthcare Providers:  GravelBags.it This test is not yet ap proved or cleared by the [place] FDA and  has been authorized for detection and/or diagnosis of SARS-CoV-2 by FDA under an Emergency Use Authorization (EUA). This EUA will remain  in effect (meaning this test can be used) for the duration of the COVID-19 declaration under Section 564(b)(1) of the Act, 21 U.S.C. section 360bbb-3(b)(1), unless the authorization is terminated or revoked sooner.    Influenza A by PCR NEGATIVE NEGATIVE Final   Influenza B by PCR NEGATIVE NEGATIVE Final    Comment: (NOTE) The Xpert Xpress  SARS-CoV-2/FLU/RSV assay is intended as an aid in  the diagnosis of influenza from Nasopharyngeal swab specimens and  should not be used as a sole basis for treatment. Nasal washings and  aspirates are unacceptable for Xpert Xpress SARS-CoV-2/FLU/RSV  testing. Fact Sheet for Patients: PinkCheek.be Fact Sheet for Healthcare Providers: GravelBags.it This test is not yet approved or cleared by the [place] FDA and  has been authorized for detection and/or diagnosis of SARS-CoV-2 by  FDA under an Emergency Use Authorization (EUA). This EUA will remain  in effect (meaning this test can be used) for the duration of the  Covid-19 declaration under Section 564(b)(1) of the Act, 21  U.S.C. section 360bbb-3(b)(1), unless the authorization is  terminated or revoked. Performed at [facility], [place] [address], [place], [place] [address]   Culture, fungus without smear     Status: None (Preliminary result)   Collection Time: 09/21/19 12:01 PM   Specimen: Soft Tissue, Other  Result Value Ref Range Status   Specimen Description TISSUE  Final   Special Requests LEFT ANKLE  Final   Culture   Final    NO FUNGUS ISOLATED AFTER 2 DAYS Performed at [facility] [address], [place], [place] [address]    Report Status PENDING  Incomplete  Aerobic/Anaerobic Culture (surgical/deep wound)     Status: None (Preliminary result)   Collection Time: 09/21/19 12:01 PM   Specimen: Soft Tissue, Other  Result Value Ref Range Status   Specimen Description TISSUE  Final   Special Requests LEFT ANKLE  Final   Gram Stain   Final    RARE WBC PRESENT, PREDOMINANTLY PMN NO ORGANISMS SEEN    Culture   Final    NO GROWTH 4 DAYS NO ANAEROBES ISOLATED; CULTURE IN PROGRESS FOR 5 DAYS Performed at [facility], [place] [address], [place], [place] [address]    Report Status PENDING  Incomplete  Acid Fast Smear (AFB)     Status:  None   Collection Time: 09/21/19 12:01 PM   Specimen:  Soft Tissue, Other  Result Value Ref Range Status   AFB Specimen Processing Concentration  Final   Acid Fast Smear Negative  Final    Comment: (NOTE) Performed At: [facility] [place], [place] [id number] Rush Farmer MD UX:[id number]    Source (AFB) TISSUE  Final    Comment: LEFT ANKLE     Studies/Results: [place] EKG SITE RITE  Result Date: 09/23/2019 If Site Rite image not attached, placement could not be confirmed due to current cardiac rhythm.     Assessment/Plan:  INTERVAL HISTORY: Daptomycin will be cost prohibitive   Active Problems:   Cellulitis of left ankle   Pain and swelling of left ankle   Subacute osteomyelitis, left ankle and foot (HCC)    Rebecca Pittman is a 78 y.o. female with nonresolving erythematous changes in her leg despite multiple courses of antibiotics status post biopsy, questionable osteomyelitis status post deep culture by Dr. [name].  She is plan to get daptomycin and cefepime for 16 April.  However the daptomycin will be cost prohibitive.  I have reviewed this case and talk to Dr. Johnnye Pittman and I think it is totally appropriate to switch the patient to vancomycin in place of daptomycin and continue this with cefepime  Diagnosis: Non resoliving erythema and questionable osteomyelitis  Culture Result: Pending  Allergies  Allergen Reactions  . Diphenhydramine Hcl Other (See Comments)    Given this to counteract an allergic reaction and condition worsened  . Other Other (See Comments)    States she had a reaction to a "gel" given to her after eye surgery.  . Sulfamethoxazole-Trimethoprim Other (See Comments)    Patient is not sure if allergy to this or contrast dye because they were given the same day, patient had a hard time breathing. Given Benadryl to counteract and condition worsened    OPAT Orders Discharge antibiotics:  Cefepime and  Vancomycin per  pharmacy protocol  Aim for Vancomycin trough 15-20 (unless otherwise indicated)   Duration:   End Date:  10/12/2019  [facility] Care Per Protocol:  Labs BI- weekly while on IV antibiotics:  x__ BMP w GFR _x_ Vancomycin trough  Labs  weekly while on IV antibiotics: x__ CBC with differential  x__ CRP _x_ ESR    _x_ Please pull PIC at completion of IV antibiotics __ Please leave PIC in place until doctor has seen patient or been notified  Fax weekly labs to [phone]  She already has hospital follow-up with Dr. Johnnye Pittman  I will sign off for now please call with further questions    LOS: 9 days   Alcide Evener 09/25/2019, 4:19 PM
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
## 2019-09-25 NOTE — Plan of Care (Signed)
  Problem: Education: Goal: Knowledge of General Education information will improve Description Including pain rating scale, medication(s)/side effects and non-pharmacologic comfort measures Outcome: Progressing   

## 2019-09-25 NOTE — Progress Notes (Signed)
PHARMACY CONSULT NOTE FOR:  OUTPATIENT  PARENTERAL ANTIBIOTIC THERAPY (OPAT)  Indication: osteomyelitis  Regimen: Vancomycin 1250 mg Q 24 hours, Cefepime 2 grams iv Q 12 End date: 10/14/19  IV antibiotic discharge orders are pended. To discharging provider:  please sign these orders via discharge navigator,  Select New Orders & click on the button choice - Manage This Unsigned Work.     Thank you for allowing pharmacy to be a part of this patient's care.  Marguerite Olea, [facility] Clinical Pharmacist Phone [phone]  09/25/2019 4:12 PM

## 2019-09-25 NOTE — TOC Progression Note (Signed)
Transition of Care [facility]) - Progression Note    Patient Details  Name: Rebecca Pittman MRN: [id number] Date of Birth: [birth year]  Transition of Care [facility]) CM/SW [place], [place] Phone Number: [phone] 09/25/2019, 4:11 PM  Clinical Narrative:     CSW was alerted by MD Florene Glen that ID has changed patient's IV abx. CSW attempted to reach out to April at [facility] to inform her that the medication had changed. CSW was unable to reach anyone at the facility.  TOC team will continue to assist with discharge planning needs.  Expected Discharge Plan: [place] Barriers to Discharge: No Barriers Identified  Expected Discharge Plan and Services Expected Discharge Plan: [place]   Discharge Planning Services: CM Consult Post Acute Care Choice: Durable Medical Equipment Living arrangements for the past 2 months: Apartment                                       Social Determinants of Health (SDOH) Interventions    Readmission Risk Interventions No flowsheet data found.

## 2019-09-26 LAB — CBC WITH DIFFERENTIAL/PLATELET
Abs Immature Granulocytes: 0.04 10*3/uL (ref 0.00–0.07)
Basophils Absolute: 0.1 10*3/uL (ref 0.0–0.1)
Basophils Relative: 1 %
Eosinophils Absolute: 0.4 10*3/uL (ref 0.0–0.5)
Eosinophils Relative: 5 %
HCT: 29 % — ABNORMAL LOW (ref 36.0–46.0)
Hemoglobin: 9.4 g/dL — ABNORMAL LOW (ref 12.0–15.0)
Immature Granulocytes: 1 %
Lymphocytes Relative: 25 %
Lymphs Abs: 2 10*3/uL (ref 0.7–4.0)
MCH: 30.5 pg (ref 26.0–34.0)
MCHC: 32.4 g/dL (ref 30.0–36.0)
MCV: 94.2 fL (ref 80.0–100.0)
Monocytes Absolute: 0.7 10*3/uL (ref 0.1–1.0)
Monocytes Relative: 9 %
Neutro Abs: 4.7 10*3/uL (ref 1.7–7.7)
Neutrophils Relative %: 59 %
Platelets: 262 10*3/uL (ref 150–400)
RBC: 3.08 MIL/uL — ABNORMAL LOW (ref 3.87–5.11)
RDW: 13.3 % (ref 11.5–15.5)
WBC: 7.9 10*3/uL (ref 4.0–10.5)
nRBC: 0 % (ref 0.0–0.2)

## 2019-09-26 LAB — AEROBIC/ANAEROBIC CULTURE W GRAM STAIN (SURGICAL/DEEP WOUND): Culture: NO GROWTH

## 2019-09-26 LAB — COMPREHENSIVE METABOLIC PANEL
ALT: 25 U/L (ref 0–44)
AST: 26 U/L (ref 15–41)
Albumin: 2.5 g/dL — ABNORMAL LOW (ref 3.5–5.0)
Alkaline Phosphatase: 61 U/L (ref 38–126)
Anion gap: 7 (ref 5–15)
BUN: 12 mg/dL (ref 8–23)
CO2: 26 mmol/L (ref 22–32)
Calcium: 8.4 mg/dL — ABNORMAL LOW (ref 8.9–10.3)
Chloride: 110 mmol/L (ref 98–111)
Creatinine, Ser: 0.69 mg/dL (ref 0.44–1.00)
GFR calc Af Amer: >60 mL/min (ref >60)
GFR calc non Af Amer: >60 mL/min (ref >60)
Glucose, Bld: 93 mg/dL (ref 70–99)
Potassium: 4.1 mmol/L (ref 3.5–5.1)
Sodium: 143 mmol/L (ref 135–145)
Total Bilirubin: 0.7 mg/dL (ref 0.3–1.2)
Total Protein: 5.3 g/dL — ABNORMAL LOW (ref 6.5–8.1)

## 2019-09-26 LAB — PHOSPHORUS: Phosphorus: 2.7 mg/dL (ref 2.5–4.6)

## 2019-09-26 LAB — MAGNESIUM: Magnesium: 2.1 mg/dL (ref 1.7–2.4)

## 2019-09-26 LAB — SARS CORONAVIRUS 2 (TAT 6-24 HRS): SARS Coronavirus 2: NEGATIVE

## 2019-09-26 NOTE — TOC Progression Note (Signed)
Transition of Care [facility]) - Progression Note    Patient Details  Name: MAYLI HANA MRN: [id number] Date of Birth: 06-06-1941  Transition of Care [facility]) CM/SW Contact  Curlene Labrum, RN Phone Number: 09/26/2019, 8:56 AM  Clinical Narrative:    Case management left a message with [facility] in [place], [place] to inquire about bed availability and admittance based on change in IV antibiotics to  Vancomycin and Cefepime for cost containment for patient.  Waiting on call back from [facility].  Called [facility] in [place] and spoke with [place] regarding bed availability for patient.  No beds available today but facility will call back if bed opens today.  Expected Discharge Plan: [place] Barriers to Discharge: No Barriers Identified  Expected Discharge Plan and Services Expected Discharge Plan: [place]   Discharge Planning Services: CM Consult Post Acute Care Choice: Durable Medical Equipment Living arrangements for the past 2 months: Apartment                                       Social Determinants of Health (SDOH) Interventions    Readmission Risk Interventions No flowsheet data found.
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
## 2019-09-26 NOTE — Plan of Care (Signed)

## 2019-09-27 LAB — CBC WITH DIFFERENTIAL/PLATELET
Abs Immature Granulocytes: 0.04 10*3/uL (ref 0.00–0.07)
Basophils Absolute: 0.1 10*3/uL (ref 0.0–0.1)
Basophils Relative: 1 %
Eosinophils Absolute: 0.3 10*3/uL (ref 0.0–0.5)
Eosinophils Relative: 4 %
HCT: 29.3 % — ABNORMAL LOW (ref 36.0–46.0)
Hemoglobin: 9.7 g/dL — ABNORMAL LOW (ref 12.0–15.0)
Immature Granulocytes: 1 %
Lymphocytes Relative: 24 %
Lymphs Abs: 1.9 10*3/uL (ref 0.7–4.0)
MCH: 31.6 pg (ref 26.0–34.0)
MCHC: 33.1 g/dL (ref 30.0–36.0)
MCV: 95.4 fL (ref 80.0–100.0)
Monocytes Absolute: 0.8 10*3/uL (ref 0.1–1.0)
Monocytes Relative: 10 %
Neutro Abs: 4.7 10*3/uL (ref 1.7–7.7)
Neutrophils Relative %: 60 %
Platelets: 293 10*3/uL (ref 150–400)
RBC: 3.07 MIL/uL — ABNORMAL LOW (ref 3.87–5.11)
RDW: 13.5 % (ref 11.5–15.5)
WBC: 7.8 10*3/uL (ref 4.0–10.5)
nRBC: 0 % (ref 0.0–0.2)

## 2019-09-27 LAB — PHOSPHORUS: Phosphorus: 2.8 mg/dL (ref 2.5–4.6)

## 2019-09-27 LAB — COMPREHENSIVE METABOLIC PANEL
ALT: 36 U/L (ref 0–44)
AST: 41 U/L (ref 15–41)
Albumin: 2.6 g/dL — ABNORMAL LOW (ref 3.5–5.0)
Alkaline Phosphatase: 61 U/L (ref 38–126)
Anion gap: 9 (ref 5–15)
BUN: 12 mg/dL (ref 8–23)
CO2: 26 mmol/L (ref 22–32)
Calcium: 8.6 mg/dL — ABNORMAL LOW (ref 8.9–10.3)
Chloride: 108 mmol/L (ref 98–111)
Creatinine, Ser: 0.59 mg/dL (ref 0.44–1.00)
GFR calc Af Amer: >60 mL/min (ref >60)
GFR calc non Af Amer: >60 mL/min (ref >60)
Glucose, Bld: 88 mg/dL (ref 70–99)
Potassium: 4 mmol/L (ref 3.5–5.1)
Sodium: 143 mmol/L (ref 135–145)
Total Bilirubin: 0.6 mg/dL (ref 0.3–1.2)
Total Protein: 5.4 g/dL — ABNORMAL LOW (ref 6.5–8.1)

## 2019-09-27 LAB — CK: Total CK: 36 U/L — ABNORMAL LOW (ref 38–234)

## 2019-09-27 LAB — MAGNESIUM: Magnesium: 2 mg/dL (ref 1.7–2.4)

## 2019-09-27 NOTE — Progress Notes (Signed)
Doing well. Cultures negative.    Wound vac to be removed. Daily Dry. Will need  dressing changes. Will need follow up ortho in 1 week

## 2019-09-27 NOTE — Progress Notes (Signed)
Occupational Therapy Treatment Patient Details Name: Rebecca Pittman MRN: [id number] DOB: Jun 01, 1941 Today's Date: 09/27/2019    History of present illness Pt is a 79 y/o female admitted for cellulitis of L lateral ankle, now s/p I&D and partial excision of fibula. No pertinent PMH.   OT comments  Pt progressing toward prior level of functioning, goals updated this session. Wound vac was removed this date. Pt now able to tolerate about 50% of weight through LLE. She required minguard to minA for ADL and functional mobility at RW level. Pt required 1vc at start of session for safe hand placement, but demonstrated good carry over throughout the rest of the session verbalizing safe hand placement and safety awareness. Pt may be able to progress to d/c home with [name]. Continue to recommend short-term SNF placement as pt lives alone and continues to require some assistance with ADL and functional mobility. Pt will continue to benefit from skilled OT services to maximize safety and independence with ADL/IADL and functional mobility. Will continue to follow acutely and progress as tolerated.    Follow Up Recommendations  SNF (pt may progress to [facility])   Equipment Recommendations  None recommended by OT    Recommendations for Other Services      Precautions / Restrictions Precautions Precautions: Fall Restrictions Weight Bearing Restrictions: No RLE Weight Bearing: Weight bearing as tolerated       Mobility Bed Mobility               General bed mobility comments: pt sitting in recliner upon arrival  Transfers Overall transfer level: Needs assistance Equipment used: Rolling walker (2 wheeled) Transfers: Sit to/from Stand Sit to Stand: Min guard         General transfer comment: minguard for safety    Balance Overall balance assessment: Needs assistance Sitting-balance support: Feet supported Sitting balance-Leahy Scale: Good     Standing balance support: No upper  extremity supported;During functional activity Standing balance-Leahy Scale: Fair Standing balance comment: able to tolerate static standing without UE support to wash her hands, unable to tolerate challenge to balance                           ADL either performed or assessed with clinical judgement   ADL Overall ADL's : Needs assistance/impaired     Grooming: Wash/dry hands;Min guard;Standing Grooming Details (indicate cue type and reason): sink lvel             Lower Body Dressing: Minimal assistance;Sit to/from stand Lower Body Dressing Details (indicate cue type and reason): minA to don sock while sitting in  recliner Toilet Transfer: Ambulation;RW;Min guard Toilet Transfer Details (indicate cue type and reason): ambulated into bathroom  Toileting- Clothing Manipulation and Hygiene: Sit to/from stand;Min guard       Functional mobility during ADLs: Rolling walker;Min guard General ADL Comments: pt able to tolerate about 50% of weight through L foot     Vision       Perception     Praxis      Cognition Arousal/Alertness: Awake/alert Behavior During Therapy: WFL for tasks assessed/performed Overall Cognitive Status: Within Functional Limits for tasks assessed                                 General Comments: pt demonstrated good safety awareness, verbalized safe hand placement with transfers  Exercises     Shoulder Instructions       General Comments      Pertinent Vitals/ Pain       Pain Assessment: 0-10 Pain Score: 4  Pain Location: L foot Pain Descriptors / Indicators: Discomfort;Sore Pain Intervention(s): Monitored during session;Limited activity within patient's tolerance  Home Living                                          Prior Functioning/Environment              Frequency  Min 2X/week        Progress Toward Goals  OT Goals(current goals can now be found in the care plan  section)  Progress towards OT goals: Progressing toward goals;Goals met and updated - see care plan  Acute Rehab OT Goals Patient Stated Goal: to be able to move and take care of self  OT Goal Formulation: With patient Time For Goal Achievement: 10/06/19 Potential to Achieve Goals: Good ADL Goals Pt Will Perform Grooming: with modified independence;standing Pt Will Perform Upper Body Bathing: sitting;with modified independence Pt Will Perform Lower Body Bathing: with modified independence;sit to/from stand Pt Will Perform Upper Body Dressing: sitting;with modified independence Pt Will Perform Lower Body Dressing: with modified independence;sit to/from stand Pt Will Transfer to Toilet: with modified independence;ambulating Pt Will Perform Toileting - Clothing Manipulation and hygiene: with modified independence;sit to/from stand  Plan Discharge plan remains appropriate    Co-evaluation                 AM-PAC OT "6 Clicks" Daily Activity     Outcome Measure   Help from another person eating meals?: None Help from another person taking care of personal grooming?: None Help from another person toileting, which includes using toliet, bedpan, or urinal?: A Little Help from another person bathing (including washing, rinsing, drying)?: A Little Help from another person to put on and taking off regular upper body clothing?: A Little Help from another person to put on and taking off regular lower body clothing?: A Little 6 Click Score: 20    End of Session Equipment Utilized During Treatment: Gait belt;Rolling walker  OT Visit Diagnosis: Pain Pain - Right/Left: Left Pain - part of body: Ankle and joints of foot   Activity Tolerance Patient tolerated treatment well   Patient Left in chair;with call bell/phone within reach;with chair alarm set   Nurse Communication Mobility status        Time: 7654-6503 OT Time Calculation (min): 31 min  Charges: OT General Charges $OT  Visit: 1 Visit OT Treatments $Self Care/Home Management : 23-37 mins  Helene Kelp OTR/L Acute Rehabilitation Services Office: [place] 09/27/2019, 3:37 PM
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
## 2019-09-27 NOTE — Plan of Care (Addendum)
Wound vac removed 1130, pt tolerated well.   Problem: Education: Goal: Knowledge of General Education information will improve Description: Including pain rating scale, medication(s)/side effects and non-pharmacologic comfort measures Outcome: Progressing   Problem: Health Behavior/Discharge Planning: Goal: Ability to manage health-related needs will improve Outcome: Progressing   Problem: Clinical Measurements: Goal: Will remain free from infection Outcome: Progressing   Problem: Coping: Goal: Level of anxiety will decrease Outcome: Progressing   Problem: Pain Managment: Goal: General experience of comfort will improve Outcome: Progressing   Problem: Safety: Goal: Ability to remain free from injury will improve Outcome: Progressing   Problem: Skin Integrity: Goal: Risk for impaired skin integrity will decrease Outcome: Progressing

## 2019-09-27 NOTE — Progress Notes (Signed)
Physical Therapy Treatment Patient Details Name: Rebecca Pittman MRN: [id number] DOB: 10-16-1940 Today's Date: 09/27/2019    History of Present Illness Pt is a 79 y/o female admitted for cellulitis of L lateral ankle, now s/p I&D and partial excision of fibula. No pertinent PMH.    PT Comments    Continuing work on functional mobility and activity tolerance;  Rebecca Pittman is still very painful L foot and ankle, and opted to keep LLE NWB during functional activity; She is happy at the though of having the VAC dressing taken off today; Took time to educate on need for L ankle dorsiflexion stretching to mitigate calf tightness while she is not on the L foot as much   Follow Up Recommendations  SNF     Equipment Recommendations  Rolling walker with 5" wheels;3in1 (PT)    Recommendations for Other Services       Precautions / Restrictions Precautions Precautions: Other (comment) Precaution Comments: wound VAC Restrictions RLE Weight Bearing: Weight bearing as tolerated    Mobility  Bed Mobility Overal bed mobility: Needs Assistance Bed Mobility: Supine to Sit     Supine to sit: Min guard     General bed mobility comments: no physical assistance needed; minguard and assist for line management  Transfers Overall transfer level: Needs assistance Equipment used: Rolling walker (2 wheeled) Transfers: Sit to/from Stand Sit to Stand: Min guard         General transfer comment: min guard for safety, cueing for safe hand placement; pt is opting to stand up NWB LLE at this time due to pain  Ambulation/Gait Ambulation/Gait assistance: Min guard Gait Distance (Feet): 5 Feet Assistive device: Rolling walker (2 wheeled) Gait Pattern/deviations: (Hop-step)     General Gait Details: Very distressed at anticipation of pain, and she opted to keep NWB this morning; slight hops on R foot; energetically taxing   Stairs             Wheelchair Mobility    Modified Rankin  (Stroke Patients Only)       Balance     Sitting balance-Leahy Scale: Good       Standing balance-Leahy Scale: Poor                              Cognition Arousal/Alertness: Awake/alert Behavior During Therapy: WFL for tasks assessed/performed Overall Cognitive Status: Within Functional Limits for tasks assessed                                        Exercises General Exercises - Lower Extremity Ankle Circles/Pumps: AROM;Left;5 reps    General Comments General comments (skin integrity, edema, etc.): Rebecca Pittman was anxious about pain, and originally wanting to wait until [facility] was taken off -- but since we weren't sure when that would be, she agreed to getting OOB, and ultimately opted to keep NWB LLE because of pain      Pertinent Vitals/Pain Pain Assessment: 0-10 Pain Score: 8  Pain Location: L foot Pain Descriptors / Indicators: Guarding;Discomfort Pain Intervention(s): Monitored during session    Home Living                      Prior Function            PT Goals (current goals can now be found in the care  plan section) Acute Rehab PT Goals Patient Stated Goal: to be able to move and take care of self  PT Goal Formulation: With patient Progress towards PT goals: Progressing toward goals    Frequency    Min 3X/week      PT Plan Current plan remains appropriate    Co-evaluation              AM-PAC PT "6 Clicks" Mobility   Outcome Measure  Help needed turning from your back to your side while in a flat bed without using bedrails?: None Help needed moving from lying on your back to sitting on the side of a flat bed without using bedrails?: None Help needed moving to and from a bed to a chair (including a wheelchair)?: A Little Help needed standing up from a chair using your arms (e.g., wheelchair or bedside chair)?: A Little Help needed to walk in hospital room?: A Little Help needed climbing 3-5 steps with a  railing? : A Lot 6 Click Score: 19    End of Session Equipment Utilized During Treatment: Gait belt Activity Tolerance: Patient tolerated treatment well Patient left: in chair;with call bell/phone within reach;with chair alarm set Nurse Communication: Mobility status PT Visit Diagnosis: Unsteadiness on feet (R26.81);Muscle weakness (generalized) (M62.81);Difficulty in walking, not elsewhere classified (R26.2);Pain Pain - Right/Left: Left Pain - part of body: Ankle and joints of foot     Time: 0838-0900 PT Time Calculation (min) (ACUTE ONLY): 22 min  Charges:  $Gait Training: 8-22 mins                     Roney Marion, [place] Pager [phone] Office [phone]    Colletta [place] 09/27/2019, 11:12 AM

## 2019-09-28 LAB — CBC WITH DIFFERENTIAL/PLATELET
Abs Immature Granulocytes: 0.06 10*3/uL (ref 0.00–0.07)
Basophils Absolute: 0.1 10*3/uL (ref 0.0–0.1)
Basophils Relative: 1 %
Eosinophils Absolute: 0.3 10*3/uL (ref 0.0–0.5)
Eosinophils Relative: 5 %
HCT: 28.6 % — ABNORMAL LOW (ref 36.0–46.0)
Hemoglobin: 9.4 g/dL — ABNORMAL LOW (ref 12.0–15.0)
Immature Granulocytes: 1 %
Lymphocytes Relative: 25 %
Lymphs Abs: 1.6 10*3/uL (ref 0.7–4.0)
MCH: 31.3 pg (ref 26.0–34.0)
MCHC: 32.9 g/dL (ref 30.0–36.0)
MCV: 95.3 fL (ref 80.0–100.0)
Monocytes Absolute: 0.7 10*3/uL (ref 0.1–1.0)
Monocytes Relative: 12 %
Neutro Abs: 3.5 10*3/uL (ref 1.7–7.7)
Neutrophils Relative %: 56 %
Platelets: 273 10*3/uL (ref 150–400)
RBC: 3 MIL/uL — ABNORMAL LOW (ref 3.87–5.11)
RDW: 13.8 % (ref 11.5–15.5)
WBC: 6.2 10*3/uL (ref 4.0–10.5)
nRBC: 0 % (ref 0.0–0.2)

## 2019-09-28 LAB — MAGNESIUM: Magnesium: 2.1 mg/dL (ref 1.7–2.4)

## 2019-09-28 LAB — COMPREHENSIVE METABOLIC PANEL
ALT: 38 U/L (ref 0–44)
AST: 36 U/L (ref 15–41)
Albumin: 2.4 g/dL — ABNORMAL LOW (ref 3.5–5.0)
Alkaline Phosphatase: 58 U/L (ref 38–126)
Anion gap: 7 (ref 5–15)
BUN: 17 mg/dL (ref 8–23)
CO2: 23 mmol/L (ref 22–32)
Calcium: 8 mg/dL — ABNORMAL LOW (ref 8.9–10.3)
Chloride: 107 mmol/L (ref 98–111)
Creatinine, Ser: 0.75 mg/dL (ref 0.44–1.00)
GFR calc Af Amer: >60 mL/min (ref >60)
GFR calc non Af Amer: >60 mL/min (ref >60)
Glucose, Bld: 89 mg/dL (ref 70–99)
Potassium: 3.9 mmol/L (ref 3.5–5.1)
Sodium: 137 mmol/L (ref 135–145)
Total Bilirubin: 0.8 mg/dL (ref 0.3–1.2)
Total Protein: 5.2 g/dL — ABNORMAL LOW (ref 6.5–8.1)

## 2019-09-28 LAB — VANCOMYCIN, PEAK: Vancomycin Pk: 60 ug/mL (ref 30–40)

## 2019-09-28 LAB — PHOSPHORUS: Phosphorus: 3.3 mg/dL (ref 2.5–4.6)

## 2019-09-28 NOTE — Care Management Important Message (Signed)
Important Message  Patient Details  Name: Rebecca Pittman MRN: [id number] Date of Birth: 10-Sep-1940   Medicare Important Message Given:  Yes     Memory Argue 09/28/2019, 2:14 PM
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
## 2019-09-28 NOTE — Plan of Care (Signed)
  Problem: Education: Goal: Knowledge of General Education information will improve Description: Including pain rating scale, medication(s)/side effects and non-pharmacologic comfort measures Outcome: Progressing  Patient able to recall discharge plan (SNF/rehab).  Problem: Clinical Measurements: Goal: Will remain free from infection Outcome: Progressing  Patient afebrile.  Problem: Activity: Goal: Risk for activity intolerance will decrease Outcome: Progressing  Patient denies pain, sob or fatigue when getting up to bathroom.  Problem: Nutrition: Goal: Adequate nutrition will be maintained Outcome: Progressing  Patient tolerating meals.  Problem: Safety: Goal: Ability to remain free from injury will improve Outcome: Progressing Patient calls staff for help before getting up.
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
## 2019-09-28 NOTE — Progress Notes (Signed)
Physical Therapy Treatment Patient Details Name: Rebecca Pittman MRN: [id number] DOB: 1940-08-29 Today's Date: 09/28/2019    History of Present Illness Pt is a 79 y/o female admitted for cellulitis of L lateral ankle, now s/p I&D and partial excision of fibula. No pertinent PMH.    PT Comments    Continuing work on functional mobility and activity tolerance;  Much better pain control today, and she is quite pleased that the [facility] dressing is dc'd; Able to incr progressive amb distance, and begin to accept more weight onto L foot in stance; Overall progressing well; Anticipate continuing good progress at post-acute rehabilitation.    Follow Up Recommendations  SNF     Equipment Recommendations  Rolling walker with 5" wheels;3in1 (PT)    Recommendations for Other Services       Precautions / Restrictions Precautions Precautions: Fall Restrictions Weight Bearing Restrictions: No RLE Weight Bearing: Weight bearing as tolerated    Mobility  Bed Mobility Overal bed mobility: Needs Assistance Bed Mobility: Supine to Sit     Supine to sit: Min guard     General bed mobility comments: Minguard for safety  Transfers Overall transfer level: Needs assistance Equipment used: Rolling walker (2 wheeled) Transfers: Sit to/from Stand Sit to Stand: Min guard         General transfer comment: minguard for safety  Ambulation/Gait Ambulation/Gait assistance: Min guard Gait Distance (Feet): 60 Feet Assistive device: Rolling walker (2 wheeled) Gait Pattern/deviations: Step-through pattern;Decreased stride length;Decreased weight shift to left Gait velocity: decreased   General Gait Details: Much improved tlerance of WBing L foot, able to take aprpox 50% of weight in L stance; good use of RW to support; cues to self-monitor for activity tolerance   Stairs             Wheelchair Mobility    Modified Rankin (Stroke Patients Only)       Balance     Sitting  balance-Leahy Scale: Good       Standing balance-Leahy Scale: Fair                              Cognition Arousal/Alertness: Awake/alert Behavior During Therapy: WFL for tasks assessed/performed Overall Cognitive Status: Within Functional Limits for tasks assessed                                        Exercises Other Exercises Other Exercises: L ankle dorsiflexion stretch, with belt around forefoot 5x with 15 second hold    General Comments        Pertinent Vitals/Pain Pain Assessment: Faces Faces Pain Scale: Hurts little more Pain Location: L foot Pain Descriptors / Indicators: Discomfort;Sore Pain Intervention(s): Monitored during session    Home Living                      Prior Function            PT Goals (current goals can now be found in the care plan section) Acute Rehab PT Goals Patient Stated Goal: to be able to move and take care of self  PT Goal Formulation: With patient Progress towards PT goals: Progressing toward goals    Frequency    Min 3X/week      PT Plan Current plan remains appropriate    Co-evaluation  AM-PAC PT "6 Clicks" Mobility   Outcome Measure  Help needed turning from your back to your side while in a flat bed without using bedrails?: None Help needed moving from lying on your back to sitting on the side of a flat bed without using bedrails?: None Help needed moving to and from a bed to a chair (including a wheelchair)?: A Little Help needed standing up from a chair using your arms (e.g., wheelchair or bedside chair)?: A Little Help needed to walk in hospital room?: A Little Help needed climbing 3-5 steps with a railing? : A Lot 6 Click Score: 19    End of Session Equipment Utilized During Treatment: Gait belt Activity Tolerance: Patient tolerated treatment well Patient left: in chair;with call bell/phone within reach;with chair alarm set Nurse Communication:  Mobility status PT Visit Diagnosis: Unsteadiness on feet (R26.81);Muscle weakness (generalized) (M62.81);Difficulty in walking, not elsewhere classified (R26.2);Pain Pain - Right/Left: Left Pain - part of body: Ankle and joints of foot     Time: 0830-0901 PT Time Calculation (min) (ACUTE ONLY): 31 min  Charges:  $Gait Training: 23-37 mins                     Roney [place], [place]  Acute Rehabilitation Services Pager [phone] Office [phone]    Colletta [place] 09/28/2019, 12:27 PM

## 2019-09-29 DIAGNOSIS — L03119 Cellulitis of unspecified part of limb: Secondary | ICD-10-CM | POA: Diagnosis not present

## 2019-09-29 DIAGNOSIS — L03112 Cellulitis of left axilla: Secondary | ICD-10-CM | POA: Diagnosis not present

## 2019-09-29 DIAGNOSIS — M2550 Pain in unspecified joint: Secondary | ICD-10-CM | POA: Diagnosis not present

## 2019-09-29 DIAGNOSIS — S99912S Unspecified injury of left ankle, sequela: Secondary | ICD-10-CM | POA: Diagnosis not present

## 2019-09-29 DIAGNOSIS — Z20828 Contact with and (suspected) exposure to other viral communicable diseases: Secondary | ICD-10-CM | POA: Diagnosis not present

## 2019-09-29 DIAGNOSIS — R262 Difficulty in walking, not elsewhere classified: Secondary | ICD-10-CM | POA: Diagnosis not present

## 2019-09-29 DIAGNOSIS — E7800 Pure hypercholesterolemia, unspecified: Secondary | ICD-10-CM | POA: Diagnosis not present

## 2019-09-29 DIAGNOSIS — M8629 Subacute osteomyelitis, multiple sites: Secondary | ICD-10-CM | POA: Diagnosis not present

## 2019-09-29 DIAGNOSIS — I10 Essential (primary) hypertension: Secondary | ICD-10-CM | POA: Diagnosis not present

## 2019-09-29 DIAGNOSIS — Z7401 Bed confinement status: Secondary | ICD-10-CM | POA: Diagnosis not present

## 2019-09-29 DIAGNOSIS — M25472 Effusion, left ankle: Secondary | ICD-10-CM | POA: Diagnosis not present

## 2019-09-29 DIAGNOSIS — D649 Anemia, unspecified: Secondary | ICD-10-CM | POA: Diagnosis not present

## 2019-09-29 DIAGNOSIS — M79662 Pain in left lower leg: Secondary | ICD-10-CM | POA: Diagnosis not present

## 2019-09-29 DIAGNOSIS — R0902 Hypoxemia: Secondary | ICD-10-CM | POA: Diagnosis not present

## 2019-09-29 DIAGNOSIS — M86272 Subacute osteomyelitis, left ankle and foot: Secondary | ICD-10-CM | POA: Diagnosis not present

## 2019-09-29 DIAGNOSIS — R05 Cough: Secondary | ICD-10-CM | POA: Diagnosis not present

## 2019-09-29 DIAGNOSIS — E785 Hyperlipidemia, unspecified: Secondary | ICD-10-CM | POA: Diagnosis not present

## 2019-09-29 DIAGNOSIS — L03116 Cellulitis of left lower limb: Secondary | ICD-10-CM | POA: Diagnosis not present

## 2019-09-29 DIAGNOSIS — M25572 Pain in left ankle and joints of left foot: Secondary | ICD-10-CM | POA: Diagnosis not present

## 2019-09-29 DIAGNOSIS — M810 Age-related osteoporosis without current pathological fracture: Secondary | ICD-10-CM | POA: Diagnosis not present

## 2019-09-29 LAB — SARS CORONAVIRUS 2 (TAT 6-24 HRS): SARS Coronavirus 2: NEGATIVE

## 2019-09-29 MED ORDER — CEFEPIME IV (FOR PTA / DISCHARGE USE ONLY): 2.0000 g | Freq: Two times a day (BID) | INTRAVENOUS | 0 refills | Status: DC

## 2019-09-29 MED ORDER — OXYCODONE HCL 5 MG PO TABS: 5.0000 mg | ORAL_TABLET | ORAL | 0 refills | Status: AC | PRN

## 2019-09-29 MED ORDER — VANCOMYCIN IV (FOR PTA / DISCHARGE USE ONLY): 1250.0000 mg | INTRAVENOUS | 0 refills | Status: AC

## 2019-09-29 MED ORDER — HEPARIN SOD (PORK) LOCK FLUSH 100 UNIT/ML IV SOLN
250.0000 [IU] | INTRAVENOUS | Status: AC | PRN
  Administered 2019-09-29: 250 [IU]
  Filled 2019-09-29: qty 2.5
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
## 2019-09-29 NOTE — Plan of Care (Signed)
  Problem: Education: Goal: Knowledge of General Education information will improve Description: Including pain rating scale, medication(s)/side effects and non-pharmacologic comfort measures 09/29/2019 0146 by Josepha Pigg, RN Outcome: Progressing 09/29/2019 0146 by Josepha Pigg, RN Outcome: Progressing   Problem: Health Behavior/Discharge Planning: Goal: Ability to manage health-related needs will improve 09/29/2019 0146 by Josepha Pigg, RN Outcome: Progressing 09/29/2019 0146 by Josepha Pigg, RN Outcome: Progressing   Problem: Clinical Measurements: Goal: Ability to maintain clinical measurements within normal limits will improve 09/29/2019 0146 by Josepha Pigg, RN Outcome: Progressing 09/29/2019 0146 by Josepha Pigg, RN Outcome: Progressing Goal: Will remain free from infection 09/29/2019 0146 by Josepha Pigg, RN Outcome: Progressing 09/29/2019 0146 by Josepha Pigg, RN Outcome: Progressing Goal: Diagnostic test results will improve 09/29/2019 0146 by Josepha Pigg, RN Outcome: Progressing 09/29/2019 0146 by Josepha Pigg, RN Outcome: Progressing Goal: Respiratory complications will improve 09/29/2019 0146 by Josepha Pigg, RN Outcome: Progressing 09/29/2019 0146 by Josepha Pigg, RN Outcome: Progressing Goal: Cardiovascular complication will be avoided 09/29/2019 0146 by Josepha Pigg, RN Outcome: Progressing 09/29/2019 0146 by Josepha Pigg, RN Outcome: Progressing   Problem: Activity: Goal: Risk for activity intolerance will decrease 09/29/2019 0146 by Josepha Pigg, RN Outcome: Progressing 09/29/2019 0146 by Josepha Pigg, RN Outcome: Progressing   Problem: Nutrition: Goal: Adequate nutrition will be maintained 09/29/2019 0146 by Josepha Pigg, RN Outcome: Progressing 09/29/2019 0146 by Josepha Pigg, RN Outcome: Progressing   Problem: Coping: Goal: Level of anxiety will decrease 09/29/2019  0146 by Josepha Pigg, RN Outcome: Progressing 09/29/2019 0146 by Josepha Pigg, RN Outcome: Progressing   Problem: Elimination: Goal: Will not experience complications related to bowel motility 09/29/2019 0146 by Josepha Pigg, RN Outcome: Progressing 09/29/2019 0146 by Josepha Pigg, RN Outcome: Progressing Goal: Will not experience complications related to urinary retention 09/29/2019 0146 by Josepha Pigg, RN Outcome: Progressing 09/29/2019 0146 by Josepha Pigg, RN Outcome: Progressing   Problem: Pain Managment: Goal: General experience of comfort will improve 09/29/2019 0146 by Josepha Pigg, RN Outcome: Progressing 09/29/2019 0146 by Josepha Pigg, RN Outcome: Progressing   Problem: Safety: Goal: Ability to remain free from injury will improve 09/29/2019 0146 by Josepha Pigg, RN Outcome: Progressing 09/29/2019 0146 by Josepha Pigg, RN Outcome: Progressing   Problem: Skin Integrity: Goal: Risk for impaired skin integrity will decrease 09/29/2019 0146 by Josepha Pigg, RN Outcome: Progressing 09/29/2019 0146 by Josepha Pigg, RN Outcome: Progressing

## 2019-09-29 NOTE — Consult Note (Signed)
   [facility] CM Inpatient Consult   09/29/2019  LEVEAH RATTERMAN 05/11/1941 [place]:[id number]  Long LOS: screen [facility]  Patient screened for length of stay hospitalization to check if potential [place] Management services are needed. Patient in the [facility] Medicare with Accountable Care Organization [ACO].  Review of patient's medical record reveals patient from Dr. Marzetta Board  discharge summary today 09/29/19 reveals which includes but not limited to:  Refractory cellulitis of the left ankle  Osteomyelitis -Followed by [name] as an outpatient,infectious disease and orthopedic surgery consulted and following patient while hospitalized. She underwent an MRI of the left ankle on 3/19 which revealed left ankle cellulitis, no fluid collection or abscess, no evidence of osteomyelitis.She is status post debridement of left ankle bone, partial excision of left fibula, with tissue rearrangement wound closure and had a wound VAC on 3/24. Wound VAC has been removed on 3/30. Surgical cultures are negative to date. She is empirically on cefepime and vancomycin per ID, will need 28 days. .     Plan:  Review of  inpatient [facility] team RNCM notes reveals patient has had both COVID vaccines and is transitioning to [place] today planned.  Continue to follow progress and disposition to assess for post hospital care management needs.   Will sign off at transition.   For questions contact:   Natividad Brood, RN BSN [facility] Liaison  [phone] business mobile phone Toll free office [phone]  Fax number: [phone] [email or web address]

## 2019-09-29 NOTE — Progress Notes (Signed)
PTAR present.  The patient will be transported to [facility] facility.  A discharge instruction packet has been printed and sent with the patient.

## 2019-09-29 NOTE — Progress Notes (Signed)
Report given to [place] at [facility] in [place].

## 2019-09-29 NOTE — Plan of Care (Signed)

## 2019-09-29 NOTE — TOC Transition Note (Signed)
Transition of Care [facility]) - CM/SW Discharge Note   Patient Details  Name: Rebecca Pittman MRN: [place]:[id number] Date of Birth: Apr 30, 1941  [facility] of [facility] CM/SW Contact:  Curlene Labrum, RN Phone Number: 09/29/2019, 11:49 AM   Clinical Narrative:    Case Management spoke with Linus Orn, Liaison with [facility], and patient's insurance authorization was approved for transfer.  Dr. Cruzita Lederer and primary nurse were notified of needed discharge clinicals.  Patient's last COVID vaccines were Moderna and were given on 08/17/2019 and 09/07/2019 - [facility] updated.  Will prepare patient for transfer to [facility] and [place], [place] and patient is calling her daughter's to make them aware.   Final next level of care: [place] Barriers to Discharge: No Barriers Identified   Patient Goals and CMS Choice Patient states their goals for this hospitalization and ongoing recovery are:: Patient sleeping but daughter's state that the patient did well in surgery and looks forward to returning home. CMS Medicare.gov Compare Post Acute Care list provided to:: Other (Comment Required)(Medicare choice given to daughters) Choice offered to / list presented to : Adult Children  Discharge Placement                       Discharge Plan and Services   Discharge Planning Services: CM Consult Post Acute Care Choice: Durable Medical Equipment                               Social Determinants of Health (SDOH) Interventions     Readmission Risk Interventions No flowsheet data found.

## 2019-10-03 ENCOUNTER — Other Ambulatory Visit: Payer: Self-pay

## 2019-10-03 ENCOUNTER — Encounter: Payer: Self-pay | Admitting: Infectious Diseases

## 2019-10-03 ENCOUNTER — Telehealth: Payer: Self-pay | Admitting: Infectious Diseases

## 2019-10-03 DIAGNOSIS — D649 Anemia, unspecified: Secondary | ICD-10-CM | POA: Diagnosis not present

## 2019-10-03 DIAGNOSIS — M2550 Pain in unspecified joint: Secondary | ICD-10-CM | POA: Diagnosis not present

## 2019-10-03 DIAGNOSIS — E7800 Pure hypercholesterolemia, unspecified: Secondary | ICD-10-CM | POA: Diagnosis not present

## 2019-10-03 DIAGNOSIS — Z20828 Contact with and (suspected) exposure to other viral communicable diseases: Secondary | ICD-10-CM | POA: Diagnosis not present

## 2019-10-03 DIAGNOSIS — L03112 Cellulitis of left axilla: Secondary | ICD-10-CM | POA: Diagnosis not present

## 2019-10-03 NOTE — Patient Outreach (Addendum)
[place] [facility]) Care Management  10/03/2019  Rebecca Pittman 08-09-1940 [id number]     Transition of Care Referral  Referral Date: 10/03/2019 Referral Source: [facility] Discharge Report Date of Discharge: 09/29/2019 Facility: [place]: [facility]     Referral received. Upon chart review noted that patient discharged from hospital to [facility].    Plan: RN CM will close case at this time.   Enzo Montgomery, RN,BSN,CCM [place] Management Telephonic Care Management Coordinator Direct Phone: [phone] Toll Free: [phone] Fax: [phone]

## 2019-10-03 NOTE — Telephone Encounter (Signed)
error 

## 2019-10-04 ENCOUNTER — Telehealth: Payer: Self-pay | Admitting: Infectious Diseases

## 2019-10-04 NOTE — Telephone Encounter (Signed)
errorer

## 2019-10-06 ENCOUNTER — Encounter: Payer: Self-pay | Admitting: Infectious Diseases

## 2019-10-06 ENCOUNTER — Encounter: Payer: Self-pay | Admitting: Physician Assistant

## 2019-10-06 ENCOUNTER — Other Ambulatory Visit: Payer: Self-pay

## 2019-10-06 ENCOUNTER — Ambulatory Visit (INDEPENDENT_AMBULATORY_CARE_PROVIDER_SITE_OTHER): Payer: Medicare HMO | Admitting: Physician Assistant

## 2019-10-06 VITALS — Ht 62.0 in | Wt 148.0 lb

## 2019-10-06 DIAGNOSIS — D649 Anemia, unspecified: Secondary | ICD-10-CM | POA: Diagnosis not present

## 2019-10-06 DIAGNOSIS — L03119 Cellulitis of unspecified part of limb: Secondary | ICD-10-CM | POA: Diagnosis not present

## 2019-10-06 DIAGNOSIS — R262 Difficulty in walking, not elsewhere classified: Secondary | ICD-10-CM | POA: Diagnosis not present

## 2019-10-06 DIAGNOSIS — M810 Age-related osteoporosis without current pathological fracture: Secondary | ICD-10-CM | POA: Diagnosis not present

## 2019-10-06 DIAGNOSIS — M86272 Subacute osteomyelitis, left ankle and foot: Secondary | ICD-10-CM

## 2019-10-06 NOTE — Progress Notes (Signed)
Office Visit Note   Patient: Rebecca Pittman           Date of Birth: [birth year]           MRN: [id number] Visit Date: 10/06/2019              Requested by: Greig Right, MD [address] [place],  [place] [address] PCP: Greig Right, MD  Chief Complaint  Patient presents with  . Left Ankle - Routine Post Op    09/21/19 debridement left ankle       HPI: This is a pleasant 79 year old woman who is now 2 weeks status post debridement of left ankle.  She has been in a nursing facility.  She is currently on IV antibiotics cefepime and vancomycin.  She is due to stop this in approximately 1 week.  She denies any fever or chills.  Assessment & Plan: Visit Diagnoses: No diagnosis found.  Plan: She will follow up in 1 week.  I believe sutures could be harvested at that time  Follow-Up Instructions: No follow-ups on file.   Ortho Exam  Patient is alert, oriented, no adenopathy, well-dressed, normal affect, normal respiratory effort.  Focused examination demonstrates healing surgical incision mild to moderate soft tissue swelling no necrotic edges no wound dehiscence.  No cellulitis.  Cultures all finalized at negative  Imaging: No results found. No images are attached to the encounter.  Labs: Lab Results  Component Value Date   LABURIC 3.8 09/20/2019   LABURIC 4.9 09/13/2019   REPTSTATUS PENDING 09/21/2019   REPTSTATUS 09/26/2019 FINAL 09/21/2019   GRAMSTAIN  09/21/2019    RARE WBC PRESENT, PREDOMINANTLY PMN NO ORGANISMS SEEN    CULT  09/21/2019    NO FUNGUS ISOLATED AFTER 15 DAYS Performed at [facility], [place] [address] [place], [place] [address]    CULT  09/21/2019    No growth aerobically or anaerobically. Performed at [facility], [place] [address], [place], [place] [address]      Lab Results  Component Value Date   ALBUMIN 2.4 (L) 09/28/2019   ALBUMIN 2.6 (L) 09/27/2019   ALBUMIN 2.5 (L) 09/26/2019   LABURIC 3.8 09/20/2019    LABURIC 4.9 09/13/2019    Lab Results  Component Value Date   MG 2.1 09/28/2019   MG 2.0 09/27/2019   MG 2.1 09/26/2019   No results found for: VD25OH  No results found for: PREALBUMIN CBC EXTENDED Latest Ref Rng & Units 09/28/2019 09/27/2019 09/26/2019  WBC 4.0 - 10.5 K/uL 6.2 7.8 7.9  RBC 3.87 - 5.11 MIL/uL 3.00(L) 3.07(L) 3.08(L)  HGB 12.0 - 15.0 g/dL 9.4(L) 9.7(L) 9.4(L)  HCT 36.0 - 46.0 % 28.6(L) 29.3(L) 29.0(L)  PLT 150 - 400 K/uL 273 293 262  NEUTROABS 1.7 - 7.7 K/uL 3.5 4.7 4.7  LYMPHSABS 0.7 - 4.0 K/uL 1.6 1.9 2.0     Body mass index is 27.07 kg/m.  Orders:  No orders of the defined types were placed in this encounter.  No orders of the defined types were placed in this encounter.    Procedures: No procedures performed  Clinical Data: No additional findings.  ROS:  All other systems negative, except as noted in the HPI. Review of Systems  Objective: Vital Signs: Ht 5\' 2"  (1.575 m)   Wt 148 lb (67.1 kg)   LMP  (LMP Unknown)   BMI 27.07 kg/m   Specialty Comments:  No specialty comments available.  PMFS History: Patient  Active Problem List   Diagnosis Date Noted  . Subacute osteomyelitis, left ankle and foot ([place])   . Hyperlipidemia 08/03/2019  . Chest pain, unspecified 08/03/2019  . Left leg swelling 01/13/2019  . Sprain of wrist 01/13/2019  . Cellulitis of left ankle 12/07/2018  . Pain and swelling of left ankle 11/29/2018  . Ecchymosis 04/09/2018  . Headache 04/09/2018  . Hematoma of left chest wall 04/09/2018  . Laceration of left lower extremity 04/09/2018  . Right thigh pain 04/09/2018  . Fracture of multiple ribs of left side 04/08/2018  . Spider bite 04/30/2017  . Dermatochalasis 07/10/2011   Past Medical History:  Diagnosis Date  . Cervical disc disease   . H/O eye surgery   . H/O foot surgery   . Multiple rib fractures   . Pseudoptosis     Family History  Problem Relation Age of Onset  . Colon cancer Mother   .  Hypertension Father   . Hypertension Sister   . Atrial fibrillation Sister   . Hypertension Brother   . Hyperlipidemia Brother     Past Surgical History:  Procedure Laterality Date  . blepharoplasty upper 2013    . BUNIONECTOMY  01/2010  . CATARACT EXTRACTION Bilateral 2012  . I & D EXTREMITY Left 09/21/2019   Procedure: DEBRIDE BONE LEFT ANKLE;  Surgeon: Newt Minion, MD;  Location: [facility];  Service: Orthopedics;  Laterality: Left;  . KIDNEY STONE SURGERY    . ROTATOR CUFF REPAIR  2005  . TUBAL LIGATION     Social History   Occupational History  . Not on file  Tobacco Use  . Smoking status: Never Smoker  . Smokeless tobacco: Never Used  Substance and Sexual Activity  . Alcohol use: Never  . Drug use: Never  . Sexual activity: Not Currently

## 2019-10-11 ENCOUNTER — Ambulatory Visit: Payer: Medicare HMO | Admitting: Infectious Diseases

## 2019-10-12 LAB — CULTURE, FUNGUS WITHOUT SMEAR

## 2019-10-13 ENCOUNTER — Other Ambulatory Visit: Payer: Self-pay

## 2019-10-13 ENCOUNTER — Ambulatory Visit: Payer: Medicare HMO | Admitting: Physician Assistant

## 2019-10-13 ENCOUNTER — Ambulatory Visit (INDEPENDENT_AMBULATORY_CARE_PROVIDER_SITE_OTHER): Payer: Medicare HMO | Admitting: Physician Assistant

## 2019-10-13 ENCOUNTER — Encounter: Payer: Self-pay | Admitting: Physician Assistant

## 2019-10-13 VITALS — Ht 62.0 in | Wt 148.0 lb

## 2019-10-13 DIAGNOSIS — M86272 Subacute osteomyelitis, left ankle and foot: Secondary | ICD-10-CM

## 2019-10-13 NOTE — Progress Notes (Signed)
Office Visit Note   Patient: Rebecca Pittman           Date of Birth: 05/06/1941           MRN: [id number] Visit Date: 10/13/2019              Requested by: Greig Right, MD [address] [place],  [place] [address] PCP: Greig Right, MD  Chief Complaint  Patient presents with  . Left Ankle - Routine Post Op    09/21/19 debridement left ankle.       HPI: Patient is 3 weeks status post irrigation debridement left ankle.  She is doing well.  She still has some swelling in the left ankle wonders if it is normal she is also wondering about some feelings she has had over her right toes.  She describes it as thread moving over her toes mostly at night she is also asking to have some calluses beneath her great toe examined.  Very painful  Assessment & Plan: Visit Diagnoses: No diagnosis found.  Plan: Sutures will be harvested today.  I encouraged her to do some Achilles stretching.  She will follow-up in 2 weeks.  Follow-Up Instructions: No follow-ups on file.   Ortho Exam  Patient is alert, oriented, no adenopathy, well-dressed, normal affect, normal respiratory effort. Right foot: She has a moderate amount of fat pad atrophy.  No swelling no cellulitis she is status post bunion surgery.  She does have 2 small calluses beneath the metatarsal head there is no surrounding erythema they are not tender for her they are fairly thin.  I did offer to debride them and she has declined as far as her left ankle she has mild soft tissue swelling no fluctuance no cellulitis incision has completely healed without drainage  Imaging: No results found. No images are attached to the encounter.  Labs: Lab Results  Component Value Date   LABURIC 3.8 09/20/2019   LABURIC 4.9 09/13/2019   REPTSTATUS 10/12/2019 FINAL 09/21/2019   REPTSTATUS 09/26/2019 FINAL 09/21/2019   GRAMSTAIN  09/21/2019    RARE WBC PRESENT, PREDOMINANTLY PMN NO ORGANISMS SEEN    CULT  09/21/2019    NO  FUNGUS ISOLATED AFTER 21 DAYS Performed at [facility], [place] [address], [place], [place] [address]    CULT  09/21/2019    No growth aerobically or anaerobically. Performed at [facility], [place] [address], [place], [place] [address]      Lab Results  Component Value Date   ALBUMIN 2.4 (L) 09/28/2019   ALBUMIN 2.6 (L) 09/27/2019   ALBUMIN 2.5 (L) 09/26/2019   LABURIC 3.8 09/20/2019   LABURIC 4.9 09/13/2019    Lab Results  Component Value Date   MG 2.1 09/28/2019   MG 2.0 09/27/2019   MG 2.1 09/26/2019   No results found for: VD25OH  No results found for: PREALBUMIN CBC EXTENDED Latest Ref Rng & Units 09/28/2019 09/27/2019 09/26/2019  WBC 4.0 - 10.5 K/uL 6.2 7.8 7.9  RBC 3.87 - 5.11 MIL/uL 3.00(L) 3.07(L) 3.08(L)  HGB 12.0 - 15.0 g/dL 9.4(L) 9.7(L) 9.4(L)  HCT 36.0 - 46.0 % 28.6(L) 29.3(L) 29.0(L)  PLT 150 - 400 K/uL 273 293 262  NEUTROABS 1.7 - 7.7 K/uL 3.5 4.7 4.7  LYMPHSABS 0.7 - 4.0 K/uL 1.6 1.9 2.0     Body mass index is 27.07 kg/m.  Orders:  No orders of the defined types were placed in this encounter.  No orders  of the defined types were placed in this encounter.    Procedures: No procedures performed  Clinical Data: No additional findings.  ROS:  All other systems negative, except as noted in the HPI. Review of Systems  Objective: Vital Signs: Ht 5\' 2"  (1.575 m)   Wt 148 lb (67.1 kg)   LMP  (LMP Unknown)   BMI 27.07 kg/m   Specialty Comments:  No specialty comments available.  PMFS History: Patient Active Problem List   Diagnosis Date Noted  . Subacute osteomyelitis, left ankle and foot ([place])   . Hyperlipidemia 08/03/2019  . Chest pain, unspecified 08/03/2019  . Left leg swelling 01/13/2019  . Sprain of wrist 01/13/2019  . Cellulitis of left ankle 12/07/2018  . Pain and swelling of left ankle 11/29/2018  . Ecchymosis 04/09/2018  . Headache 04/09/2018  . Hematoma of left chest wall 04/09/2018  . Laceration of left lower  extremity 04/09/2018  . Right thigh pain 04/09/2018  . Fracture of multiple ribs of left side 04/08/2018  . Spider bite 04/30/2017  . Dermatochalasis 07/10/2011   Past Medical History:  Diagnosis Date  . Cervical disc disease   . H/O eye surgery   . H/O foot surgery   . Multiple rib fractures   . Pseudoptosis     Family History  Problem Relation Age of Onset  . Colon cancer Mother   . Hypertension Father   . Hypertension Sister   . Atrial fibrillation Sister   . Hypertension Brother   . Hyperlipidemia Brother     Past Surgical History:  Procedure Laterality Date  . blepharoplasty upper 2013    . BUNIONECTOMY  01/2010  . CATARACT EXTRACTION Bilateral 2012  . I & D EXTREMITY Left 09/21/2019   Procedure: DEBRIDE BONE LEFT ANKLE;  Surgeon: Newt Minion, MD;  Location: [facility];  Service: Orthopedics;  Laterality: Left;  . KIDNEY STONE SURGERY    . ROTATOR CUFF REPAIR  2005  . TUBAL LIGATION     Social History   Occupational History  . Not on file  Tobacco Use  . Smoking status: Never Smoker  . Smokeless tobacco: Never Used  Substance and Sexual Activity  . Alcohol use: Never  . Drug use: Never  . Sexual activity: Not Currently

## 2019-10-18 ENCOUNTER — Other Ambulatory Visit: Payer: Self-pay

## 2019-10-18 ENCOUNTER — Ambulatory Visit (INDEPENDENT_AMBULATORY_CARE_PROVIDER_SITE_OTHER): Payer: Medicare HMO | Admitting: Infectious Diseases

## 2019-10-18 ENCOUNTER — Encounter: Payer: Self-pay | Admitting: Infectious Diseases

## 2019-10-18 DIAGNOSIS — L03116 Cellulitis of left lower limb: Secondary | ICD-10-CM

## 2019-10-18 NOTE — Patient Outreach (Signed)
[place] [facility]) Care Management  10/18/2019  Rebecca Pittman 10/05/1940 [place]:[id number]     Transition of Care Referral  Referral Date: 10/18/2019 Referral Source: [facility] Discharge Report Date of Discharge: 4/18/20212 Facility: [facility] Medicare    Referral received. Transition of care calls being completed via EMMI-automated calls. RN CM will outreach patient for any red flags received.     Plan: RN CM will close case at this time.    Enzo Montgomery, RN,BSN,CCM Center Management Telephonic Care Management Coordinator Direct Phone: [phone] Toll Free: [phone] Fax: [phone]

## 2019-10-18 NOTE — Assessment & Plan Note (Addendum)
Her skin changes persist.  I am not sure how much her anbx helped, certainly her wt bearing is better.  Will get her in with derm for further eval.  They will call her.  ok'd that she use mupirocin on the wound.  Will see her back in 3 weeks.
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
## 2019-10-19 DIAGNOSIS — M793 Panniculitis, unspecified: Secondary | ICD-10-CM | POA: Diagnosis not present

## 2019-10-27 ENCOUNTER — Encounter: Payer: Self-pay | Admitting: Physician Assistant

## 2019-10-27 ENCOUNTER — Other Ambulatory Visit: Payer: Self-pay

## 2019-10-27 ENCOUNTER — Ambulatory Visit (INDEPENDENT_AMBULATORY_CARE_PROVIDER_SITE_OTHER): Payer: Medicare HMO | Admitting: Physician Assistant

## 2019-10-27 VITALS — Ht 62.0 in | Wt 146.0 lb

## 2019-10-27 DIAGNOSIS — M86272 Subacute osteomyelitis, left ankle and foot: Secondary | ICD-10-CM

## 2019-10-27 NOTE — Progress Notes (Signed)
Office Visit Note   Patient: Rebecca Pittman           Date of Birth: 30-Sep-1940           MRN: [id number] Visit Date: 10/27/2019              Requested by: Greig Right, MD [address] [place],  [place] [address] PCP: Greig Right, MD  Chief Complaint  Patient presents with  . Left Ankle - Routine Post Op     09/21/19 debridement left ankle         HPI: Patient presents in follow-up for her left ankle.  She is status post irrigation and debridement of her left lateral ankle.  Cultures did not grow out any bacteria.  She is overall doing quite well and much better than prior to surgery.  Her only complaint is of some night pain.  She is not sure if she is laying on the ankle but it is not that painful during the day only at night she is wondering if this will get better.  She also has swelling.  She has ordered some compression stockings and is in the process of receiving those  Assessment & Plan: Visit Diagnoses: No diagnosis found.  Plan: I have told her to still very early in the recovery process I do think wearing good compression stockings will help her.  I do think she has some hypersensitivity around the area of the incision and I am asked her that she do some desensitization in this area.  She will follow up specifically with Dr. [name] she has not seen him since surgery in 4 weeks  Follow-Up Instructions: No follow-ups on file.   Ortho Exam  Patient is alert, oriented, no adenopathy, well-dressed, normal affect, normal respiratory effort. Focused examination demonstrates well-healed surgical incision mild to moderate soft tissue swelling.  No cellulitis she does have some hypersensitivity to touch Imaging: No results found. No images are attached to the encounter.  Labs: Lab Results  Component Value Date   LABURIC 3.8 09/20/2019   LABURIC 4.9 09/13/2019   REPTSTATUS 10/12/2019 FINAL 09/21/2019   REPTSTATUS 09/26/2019 FINAL 09/21/2019   GRAMSTAIN   09/21/2019    RARE WBC PRESENT, PREDOMINANTLY PMN NO ORGANISMS SEEN    CULT  09/21/2019    NO FUNGUS ISOLATED AFTER 21 DAYS Performed at [facility], [place] [address] [place], [place] [address]    CULT  09/21/2019    No growth aerobically or anaerobically. Performed at [facility], [place] [address], [place],  [address]      Lab Results  Component Value Date   ALBUMIN 2.4 (L) 09/28/2019   ALBUMIN 2.6 (L) 09/27/2019   ALBUMIN 2.5 (L) 09/26/2019   LABURIC 3.8 09/20/2019   LABURIC 4.9 09/13/2019    Lab Results  Component Value Date   MG 2.1 09/28/2019   MG 2.0 09/27/2019   MG 2.1 09/26/2019   No results found for: VD25OH  No results found for: PREALBUMIN CBC EXTENDED Latest Ref Rng & Units 09/28/2019 09/27/2019 09/26/2019  WBC 4.0 - 10.5 K/uL 6.2 7.8 7.9  RBC 3.87 - 5.11 MIL/uL 3.00(L) 3.07(L) 3.08(L)  HGB 12.0 - 15.0 g/dL 9.4(L) 9.7(L) 9.4(L)  HCT 36.0 - 46.0 % 28.6(L) 29.3(L) 29.0(L)  PLT 150 - 400 K/uL 273 293 262  NEUTROABS 1.7 - 7.7 K/uL 3.5 4.7 4.7  LYMPHSABS 0.7 - 4.0 K/uL 1.6 1.9 2.0     Body mass  index is 26.7 kg/m.  Orders:  No orders of the defined types were placed in this encounter.  No orders of the defined types were placed in this encounter.    Procedures: No procedures performed  Clinical Data: No additional findings.  ROS:  All other systems negative, except as noted in the HPI. Review of Systems  Objective: Vital Signs: Ht 5\' 2"  (1.575 m)   Wt 146 lb (66.2 kg)   LMP  (LMP Unknown)   BMI 26.70 kg/m   Specialty Comments:  No specialty comments available.  PMFS History: Patient Active Problem List   Diagnosis Date Noted  . Subacute osteomyelitis, left ankle and foot ([facility])   . Hyperlipidemia 08/03/2019  . Chest pain, unspecified 08/03/2019  . Left leg swelling 01/13/2019  . Sprain of wrist 01/13/2019  . Cellulitis of left ankle 12/07/2018  . Pain and swelling of left ankle 11/29/2018  . Ecchymosis 04/09/2018   . Headache 04/09/2018  . Hematoma of left chest wall 04/09/2018  . Laceration of left lower extremity 04/09/2018  . Right thigh pain 04/09/2018  . Fracture of multiple ribs of left side 04/08/2018  . Spider bite 04/30/2017  . Dermatochalasis 07/10/2011   Past Medical History:  Diagnosis Date  . Cervical disc disease   . H/O eye surgery   . H/O foot surgery   . Multiple rib fractures   . Pseudoptosis     Family History  Problem Relation Age of Onset  . Colon cancer Mother   . Hypertension Father   . Hypertension Sister   . Atrial fibrillation Sister   . Hypertension Brother   . Hyperlipidemia Brother     Past Surgical History:  Procedure Laterality Date  . blepharoplasty upper 2013    . BUNIONECTOMY  01/2010  . CATARACT EXTRACTION Bilateral 2012  . I & D EXTREMITY Left 09/21/2019   Procedure: DEBRIDE BONE LEFT ANKLE;  Surgeon: Newt Minion, MD;  Location: [facility];  Service: Orthopedics;  Laterality: Left;  . KIDNEY STONE SURGERY    . ROTATOR CUFF REPAIR  2005  . TUBAL LIGATION     Social History   Occupational History  . Not on file  Tobacco Use  . Smoking status: Never Smoker  . Smokeless tobacco: Never Used  Substance and Sexual Activity  . Alcohol use: Never  . Drug use: Never  . Sexual activity: Not Currently

## 2019-11-01 DIAGNOSIS — M868X7 Other osteomyelitis, ankle and foot: Secondary | ICD-10-CM | POA: Diagnosis not present

## 2019-11-04 LAB — ACID FAST CULTURE WITH REFLEXED SENSITIVITIES (MYCOBACTERIA): Acid Fast Culture: NEGATIVE

## 2019-11-08 ENCOUNTER — Other Ambulatory Visit: Payer: Self-pay

## 2019-11-08 ENCOUNTER — Encounter: Payer: Self-pay | Admitting: Infectious Diseases

## 2019-11-08 ENCOUNTER — Ambulatory Visit (INDEPENDENT_AMBULATORY_CARE_PROVIDER_SITE_OTHER): Payer: Medicare HMO | Admitting: Infectious Diseases

## 2019-11-08 DIAGNOSIS — L03116 Cellulitis of left lower limb: Secondary | ICD-10-CM | POA: Diagnosis not present

## 2019-11-08 DIAGNOSIS — M793 Panniculitis, unspecified: Secondary | ICD-10-CM
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
## 2019-11-08 NOTE — Assessment & Plan Note (Signed)
Appreciate derm eval.  May consider f/u prn.

## 2019-11-08 NOTE — Assessment & Plan Note (Addendum)
She is doing very well.  She had derm f/u and was suggested to have a non-infectious cause of her sx (sclerosing paniculitis).  I asked her to rtc if she has any further sx, erythema, pain, ect.

## 2019-11-24 ENCOUNTER — Encounter: Payer: Self-pay | Admitting: Orthopedic Surgery

## 2019-11-24 ENCOUNTER — Other Ambulatory Visit: Payer: Self-pay

## 2019-11-24 ENCOUNTER — Ambulatory Visit (INDEPENDENT_AMBULATORY_CARE_PROVIDER_SITE_OTHER): Payer: Medicare HMO | Admitting: Orthopedic Surgery

## 2019-11-24 VITALS — Ht 62.0 in | Wt 142.0 lb

## 2019-11-24 DIAGNOSIS — M86272 Subacute osteomyelitis, left ankle and foot: Secondary | ICD-10-CM

## 2019-11-24 NOTE — Progress Notes (Signed)
Office Visit Note   Patient: Rebecca Pittman           Date of Birth: 1941-06-20           MRN: [id number] Visit Date: 11/24/2019              Requested by: Greig Right, MD [address] [place],  [place] [address] PCP: Greig Right, MD  Chief Complaint  Patient presents with  . Left Ankle - Routine Post Op    09/21/19 debridement left ankle       HPI: Patient is a 79 year old woman who presents status post irrigation debridement infection distal fibula left ankle 2 months ago.  Assessment & Plan: Visit Diagnoses:  1. Subacute osteomyelitis, left ankle and foot ([facility])     Plan: Incision is healed well there is no redness no cellulitis no drainage recommended scar massage range of motion exercises and compression stockings for her varicose veins.  Follow-Up Instructions: Return if symptoms worsen or fail to improve.   Ortho Exam  Patient is alert, oriented, no adenopathy, well-dressed, normal affect, normal respiratory effort. Examination the surgical incision is well-healed there is no redness no cellulitis no drainage she does have adhesions from scar tissue and scar massage was discussed and demonstrated.  Patient has some varicose veins swelling medially and recommend she continue wearing her compression stockings  Imaging: No results found. No images are attached to the encounter.  Labs: Lab Results  Component Value Date   LABURIC 3.8 09/20/2019   LABURIC 4.9 09/13/2019   REPTSTATUS 10/12/2019 FINAL 09/21/2019   REPTSTATUS 09/26/2019 FINAL 09/21/2019   GRAMSTAIN  09/21/2019    RARE WBC PRESENT, PREDOMINANTLY PMN NO ORGANISMS SEEN    CULT  09/21/2019    NO FUNGUS ISOLATED AFTER 21 DAYS Performed at [facility], [place] [address], [place], [place] [address]    CULT  09/21/2019    No growth aerobically or anaerobically. Performed at [facility], [place] [address] [place], [place] [address]      Lab Results  Component Value Date     ALBUMIN 2.4 (L) 09/28/2019   ALBUMIN 2.6 (L) 09/27/2019   ALBUMIN 2.5 (L) 09/26/2019   LABURIC 3.8 09/20/2019   LABURIC 4.9 09/13/2019    Lab Results  Component Value Date   MG 2.1 09/28/2019   MG 2.0 09/27/2019   MG 2.1 09/26/2019   No results found for: VD25OH  No results found for: PREALBUMIN CBC EXTENDED Latest Ref Rng & Units 09/28/2019 09/27/2019 09/26/2019  WBC 4.0 - 10.5 K/uL 6.2 7.8 7.9  RBC 3.87 - 5.11 MIL/uL 3.00(L) 3.07(L) 3.08(L)  HGB 12.0 - 15.0 g/dL 9.4(L) 9.7(L) 9.4(L)  HCT 36.0 - 46.0 % 28.6(L) 29.3(L) 29.0(L)  PLT 150 - 400 K/uL 273 293 262  NEUTROABS 1.7 - 7.7 K/uL 3.5 4.7 4.7  LYMPHSABS 0.7 - 4.0 K/uL 1.6 1.9 2.0     Body mass index is 25.97 kg/m.  Orders:  No orders of the defined types were placed in this encounter.  No orders of the defined types were placed in this encounter.    Procedures: No procedures performed  Clinical Data: No additional findings.  ROS:  All other systems negative, except as noted in the HPI. Review of Systems  Objective: Vital Signs: Ht 5\' 2"  (1.575 m)   Wt 142 lb (64.4 kg)   LMP  (LMP Unknown)   BMI 25.97 kg/m   Specialty Comments:  No specialty  comments available.  PMFS History: Patient Active Problem List   Diagnosis Date Noted  . Sclerosing panniculitis 11/08/2019  . Subacute osteomyelitis, left ankle and foot ([place])   . Hyperlipidemia 08/03/2019  . Chest pain, unspecified 08/03/2019  . Left leg swelling 01/13/2019  . Sprain of wrist 01/13/2019  . Cellulitis of left ankle 12/07/2018  . Pain and swelling of left ankle 11/29/2018  . Ecchymosis 04/09/2018  . Headache 04/09/2018  . Hematoma of left chest wall 04/09/2018  . Laceration of left lower extremity 04/09/2018  . Right thigh pain 04/09/2018  . Fracture of multiple ribs of left side 04/08/2018  . Spider bite 04/30/2017  . Dermatochalasis 07/10/2011   Past Medical History:  Diagnosis Date  . Cervical disc disease   . H/O eye surgery    . H/O foot surgery   . Multiple rib fractures   . Pseudoptosis     Family History  Problem Relation Age of Onset  . Colon cancer Mother   . Hypertension Father   . Hypertension Sister   . Atrial fibrillation Sister   . Hypertension Brother   . Hyperlipidemia Brother     Past Surgical History:  Procedure Laterality Date  . blepharoplasty upper 2013    . BUNIONECTOMY  01/2010  . CATARACT EXTRACTION Bilateral 2012  . I & D EXTREMITY Left 09/21/2019   Procedure: DEBRIDE BONE LEFT ANKLE;  Surgeon: Newt Minion, MD;  Location: [facility];  Service: Orthopedics;  Laterality: Left;  . KIDNEY STONE SURGERY    . ROTATOR CUFF REPAIR  2005  . TUBAL LIGATION     Social History   Occupational History  . Not on file  Tobacco Use  . Smoking status: Never Smoker  . Smokeless tobacco: Never Used  Substance and Sexual Activity  . Alcohol use: Never  . Drug use: Never  . Sexual activity: Not Currently

## 2019-12-07 DIAGNOSIS — L820 Inflamed seborrheic keratosis: Secondary | ICD-10-CM | POA: Diagnosis not present

## 2019-12-07 DIAGNOSIS — D2239 Melanocytic nevi of other parts of face: Secondary | ICD-10-CM | POA: Diagnosis not present

## 2020-01-26 DIAGNOSIS — N6012 Diffuse cystic mastopathy of left breast: Secondary | ICD-10-CM | POA: Diagnosis not present

## 2020-01-26 DIAGNOSIS — R928 Other abnormal and inconclusive findings on diagnostic imaging of breast: Secondary | ICD-10-CM | POA: Diagnosis not present

## 2020-02-16 DIAGNOSIS — B079 Viral wart, unspecified: Secondary | ICD-10-CM | POA: Diagnosis not present

## 2020-02-16 DIAGNOSIS — L300 Nummular dermatitis: Secondary | ICD-10-CM | POA: Diagnosis not present

## 2020-02-28 DIAGNOSIS — L301 Dyshidrosis [pompholyx]: Secondary | ICD-10-CM | POA: Diagnosis not present

## 2020-02-28 DIAGNOSIS — L570 Actinic keratosis: Secondary | ICD-10-CM | POA: Diagnosis not present

## 2020-03-07 DIAGNOSIS — N3000 Acute cystitis without hematuria: Secondary | ICD-10-CM | POA: Diagnosis not present

## 2020-03-16 DIAGNOSIS — J342 Deviated nasal septum: Secondary | ICD-10-CM | POA: Diagnosis not present

## 2020-03-16 DIAGNOSIS — R0981 Nasal congestion: Secondary | ICD-10-CM | POA: Diagnosis not present

## 2020-03-16 DIAGNOSIS — J3489 Other specified disorders of nose and nasal sinuses: Secondary | ICD-10-CM | POA: Diagnosis not present

## 2020-05-17 DIAGNOSIS — L84 Corns and callosities: Secondary | ICD-10-CM | POA: Diagnosis not present

## 2020-05-17 DIAGNOSIS — B070 Plantar wart: Secondary | ICD-10-CM | POA: Diagnosis not present

## 2020-05-17 DIAGNOSIS — B353 Tinea pedis: Secondary | ICD-10-CM | POA: Diagnosis not present

## 2020-05-30 DIAGNOSIS — E7800 Pure hypercholesterolemia, unspecified: Secondary | ICD-10-CM | POA: Diagnosis not present

## 2020-05-30 DIAGNOSIS — Z23 Encounter for immunization: Secondary | ICD-10-CM | POA: Diagnosis not present

## 2020-05-30 DIAGNOSIS — M8589 Other specified disorders of bone density and structure, multiple sites: Secondary | ICD-10-CM | POA: Diagnosis not present

## 2020-05-30 DIAGNOSIS — E669 Obesity, unspecified: Secondary | ICD-10-CM | POA: Diagnosis not present

## 2020-05-30 DIAGNOSIS — Z0000 Encounter for general adult medical examination without abnormal findings: Secondary | ICD-10-CM | POA: Diagnosis not present

## 2020-05-30 DIAGNOSIS — Z6828 Body mass index (BMI) 28.0-28.9, adult: Secondary | ICD-10-CM | POA: Diagnosis not present

## 2020-06-01 DIAGNOSIS — B353 Tinea pedis: Secondary | ICD-10-CM | POA: Diagnosis not present

## 2020-06-01 DIAGNOSIS — B070 Plantar wart: Secondary | ICD-10-CM | POA: Diagnosis not present

## 2020-08-03 DIAGNOSIS — G478 Other sleep disorders: Secondary | ICD-10-CM | POA: Diagnosis not present

## 2020-08-03 DIAGNOSIS — J3489 Other specified disorders of nose and nasal sinuses: Secondary | ICD-10-CM | POA: Diagnosis not present

## 2020-08-03 DIAGNOSIS — R0981 Nasal congestion: Secondary | ICD-10-CM | POA: Diagnosis not present

## 2020-08-03 DIAGNOSIS — J343 Hypertrophy of nasal turbinates: Secondary | ICD-10-CM | POA: Diagnosis not present

## 2020-08-03 DIAGNOSIS — J342 Deviated nasal septum: Secondary | ICD-10-CM | POA: Diagnosis not present

## 2020-08-14 DIAGNOSIS — Z6828 Body mass index (BMI) 28.0-28.9, adult: Secondary | ICD-10-CM | POA: Diagnosis not present

## 2020-08-14 DIAGNOSIS — E663 Overweight: Secondary | ICD-10-CM | POA: Diagnosis not present

## 2020-08-14 DIAGNOSIS — Z01810 Encounter for preprocedural cardiovascular examination: Secondary | ICD-10-CM | POA: Diagnosis not present

## 2020-08-14 DIAGNOSIS — J342 Deviated nasal septum: Secondary | ICD-10-CM | POA: Diagnosis not present

## 2020-08-29 DIAGNOSIS — M7061 Trochanteric bursitis, right hip: Secondary | ICD-10-CM | POA: Diagnosis not present

## 2020-10-09 DIAGNOSIS — J342 Deviated nasal septum: Secondary | ICD-10-CM | POA: Diagnosis not present

## 2020-10-09 DIAGNOSIS — J343 Hypertrophy of nasal turbinates: Secondary | ICD-10-CM | POA: Diagnosis not present

## 2020-10-09 DIAGNOSIS — R0981 Nasal congestion: Secondary | ICD-10-CM | POA: Diagnosis not present

## 2020-10-09 DIAGNOSIS — J3489 Other specified disorders of nose and nasal sinuses: Secondary | ICD-10-CM | POA: Diagnosis not present

## 2020-10-23 DIAGNOSIS — L0390 Cellulitis, unspecified: Secondary | ICD-10-CM | POA: Diagnosis not present

## 2020-10-23 DIAGNOSIS — B029 Zoster without complications: Secondary | ICD-10-CM | POA: Diagnosis not present

## 2020-10-27 DIAGNOSIS — R519 Headache, unspecified: Secondary | ICD-10-CM | POA: Diagnosis not present

## 2020-10-27 DIAGNOSIS — R29700 NIHSS score 0: Secondary | ICD-10-CM | POA: Diagnosis not present

## 2020-10-27 DIAGNOSIS — G500 Trigeminal neuralgia: Secondary | ICD-10-CM | POA: Diagnosis not present

## 2020-10-27 DIAGNOSIS — B029 Zoster without complications: Secondary | ICD-10-CM | POA: Diagnosis not present

## 2020-11-01 DIAGNOSIS — B029 Zoster without complications: Secondary | ICD-10-CM | POA: Diagnosis not present

## 2020-11-01 DIAGNOSIS — G500 Trigeminal neuralgia: Secondary | ICD-10-CM | POA: Diagnosis not present

## 2020-12-06 DIAGNOSIS — H43393 Other vitreous opacities, bilateral: Secondary | ICD-10-CM | POA: Diagnosis not present

## 2020-12-07 DIAGNOSIS — G500 Trigeminal neuralgia: Secondary | ICD-10-CM | POA: Diagnosis not present

## 2020-12-24 DIAGNOSIS — J343 Hypertrophy of nasal turbinates: Secondary | ICD-10-CM | POA: Diagnosis not present

## 2020-12-24 DIAGNOSIS — R0981 Nasal congestion: Secondary | ICD-10-CM | POA: Diagnosis not present

## 2020-12-24 DIAGNOSIS — J342 Deviated nasal septum: Secondary | ICD-10-CM | POA: Diagnosis not present

## 2020-12-24 DIAGNOSIS — Z8619 Personal history of other infectious and parasitic diseases: Secondary | ICD-10-CM | POA: Diagnosis not present

## 2020-12-24 DIAGNOSIS — J3489 Other specified disorders of nose and nasal sinuses: Secondary | ICD-10-CM | POA: Diagnosis not present

## 2020-12-24 DIAGNOSIS — H9201 Otalgia, right ear: Secondary | ICD-10-CM | POA: Diagnosis not present

## 2021-01-21 DIAGNOSIS — R69 Illness, unspecified: Secondary | ICD-10-CM | POA: Diagnosis not present

## 2021-01-31 DIAGNOSIS — G500 Trigeminal neuralgia: Secondary | ICD-10-CM | POA: Diagnosis not present

## 2021-04-05 IMAGING — MR MR ANKLE*L* WO/W CM
7 of 8 series · 39 of 40 positions shown · IV contrast (gadavist)
Comparison: X-ray 08/22/2019, MRI 08/22/2019

CLINICAL DATA: Lateral left ankle infection

EXAM:
MRI OF THE LEFT ANKLE WITHOUT AND WITH CONTRAST
TECHNIQUE: Multiplanar, multisequence MR imaging of the ankle was performed
before and after the administration of intravenous contrast.
CONTRAST:  7mL GADAVIST GADOBUTROL 1 MMOL/ML IV SOLN

[Series 3: T1 · axial · left · 3.0mm · 0.62mm/px · z∈[-69,+51]mm · 6 of 35 slices shown (1 of 2)]
[im 1/35]
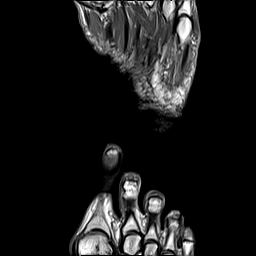
[im 7/35]
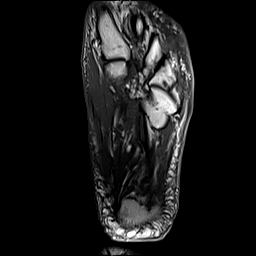
[im 14/35]
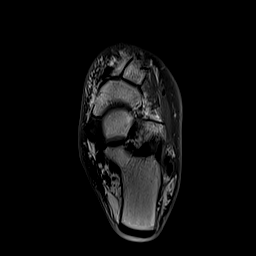
[im 21/35]
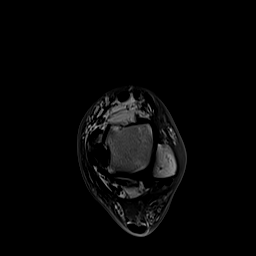
[im 28/35]
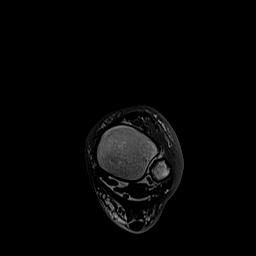
[im 35/35]
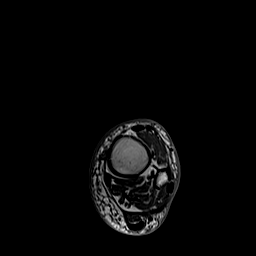

[Series 4: T2 fat-sat · axial · left · 3.0mm · 0.50mm/px · z∈[-69,+51]mm · 6 of 35 slices shown (1 of 2)]
[im 1/35]
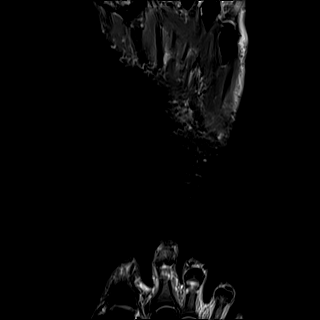
[im 7/35]
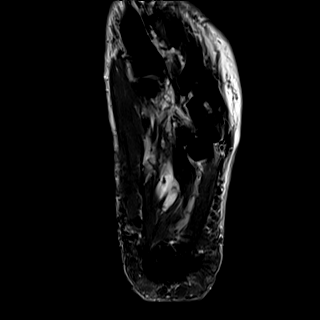
[im 14/35]
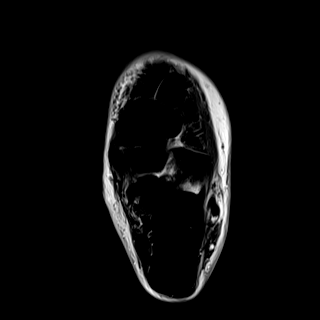
[im 21/35]
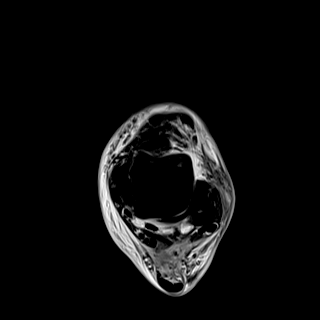
[im 28/35]
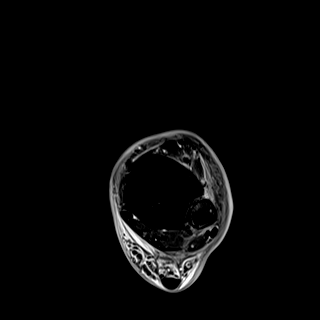
[im 35/35]
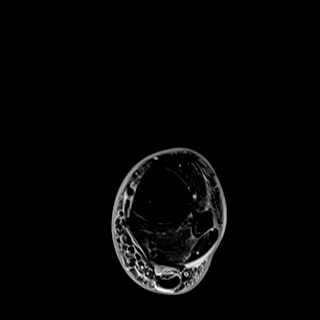

[Series 5: T2 fat-sat · coronal · left · 3.0mm · 0.70mm/px · 6 of 36 slices shown (2 of 2)]
[im 1/36]
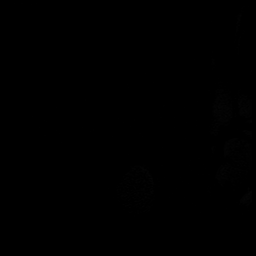
[im 8/36]
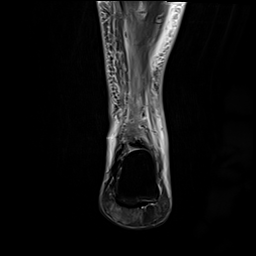
[im 15/36]
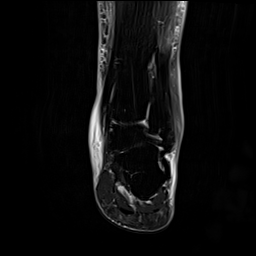
[im 22/36]
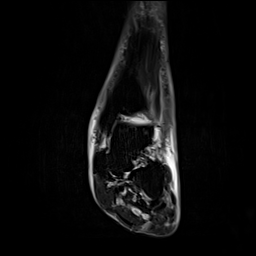
[im 29/36]
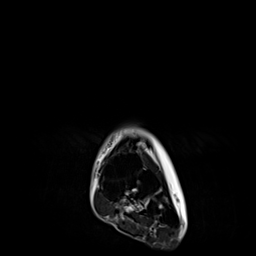
[im 36/36]
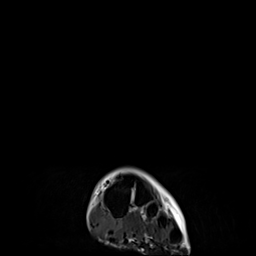

[Series 6: T1 · coronal · left · 3.0mm · 0.70mm/px · 6 of 36 slices shown (2 of 2)]
[im 1/36]
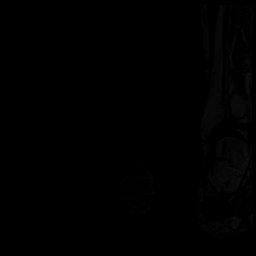
[im 8/36]
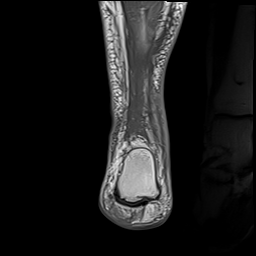
[im 15/36]
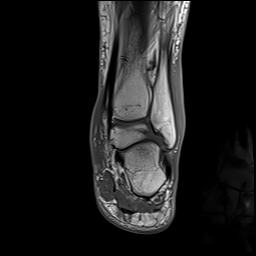
[im 22/36]
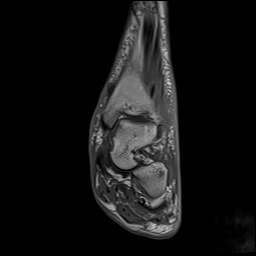
[im 29/36]
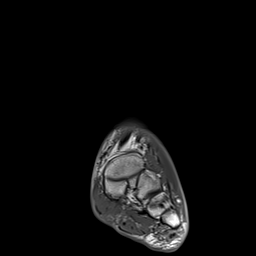
[im 36/36]
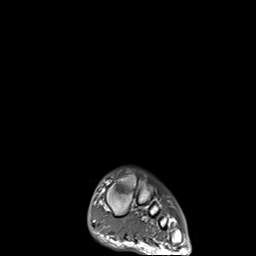

[Series 7: STIR · sagittal · left · 3.0mm · 0.35mm/px · 3 of 21 slices shown]
[im 1/21]
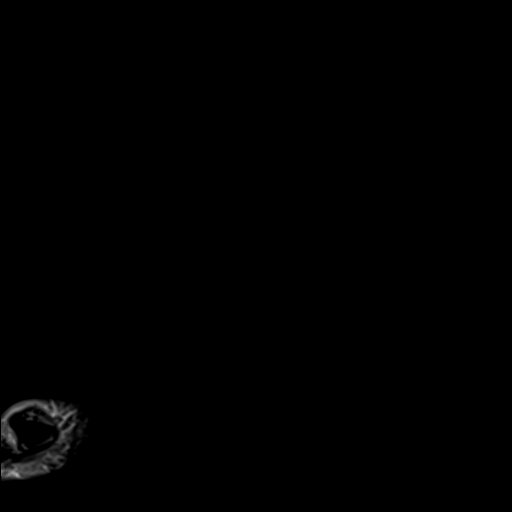
[im 11/21]
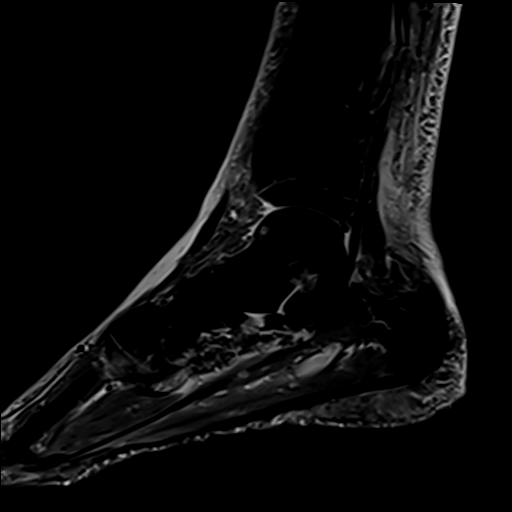
[im 21/21]
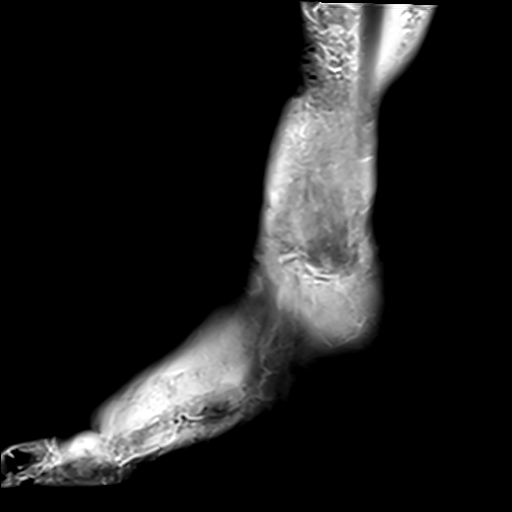

[Series 8: T1 fat-sat · axial · non-contrast · left · 3.0mm · 0.62mm/px · z∈[-69,+51]mm · 6 of 35 slices shown]
[im 1/35]
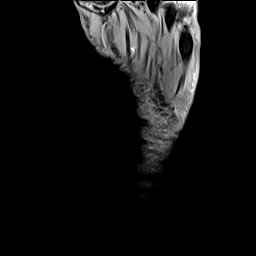
[im 7/35]
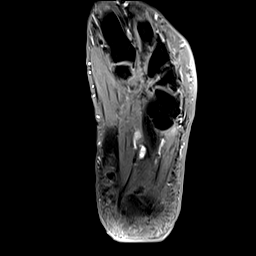
[im 14/35]
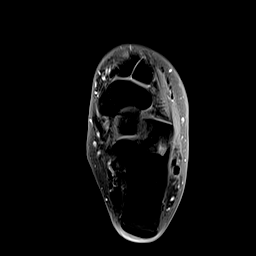
[im 21/35]
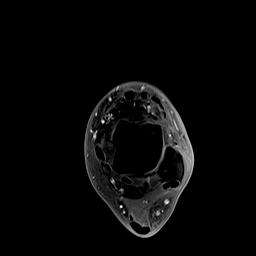
[im 28/35]
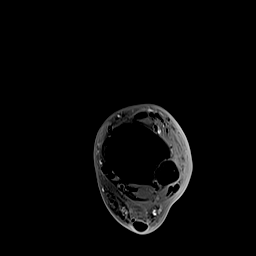
[im 35/35]
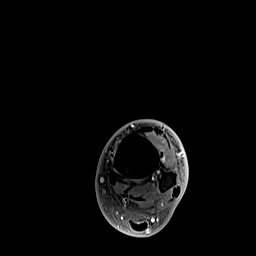

[Series 9: T1 fat-sat post-contrast · axial · left · 3.0mm · 0.62mm/px · z∈[-69,+51]mm · 6 of 35 slices shown]
[im 1/35]
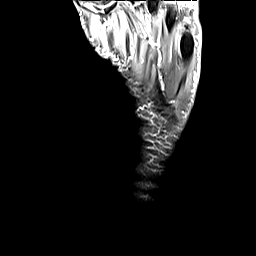
[im 7/35]
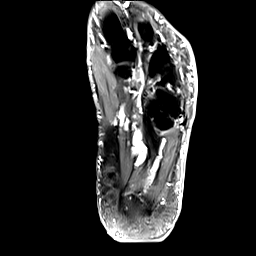
[im 14/35]
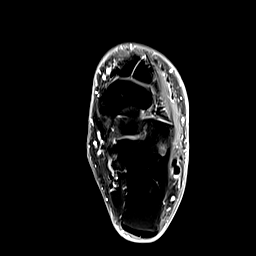
[im 21/35]
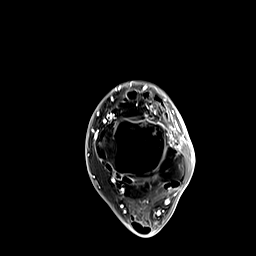
[im 28/35]
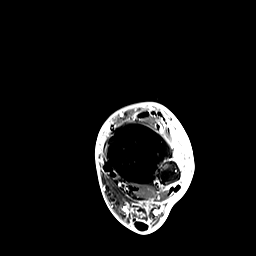
[im 35/35]
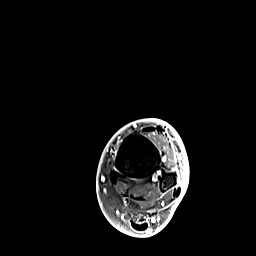

[39 of 40 positions shown; findings below may reference images not displayed]

FINDINGS: TENDONS

Peroneal: Intact peroneus longus and peroneus brevis tendons. Trace
tenosynovial fluid, similar in degree to prior MRI.

Posteromedial: Intact tibialis posterior, flexor hallucis longus and
flexor digitorum longus tendons.

Anterior: Intact tibialis anterior, extensor hallucis longus and
extensor digitorum longus tendons.

Achilles: Intact.

Plantar Fascia: Intact.

LIGAMENTS

Lateral: Intact.

Medial: Intact.

CARTILAGE

Ankle Joint: No joint effusion or chondral defect.

Subtalar Joints/Sinus Tarsi: No chondral defect. Small amount of
fluid dorsal to the posterior subtalar joint, similar in size to
prior MRI.

Bones: Very mild bone marrow edema within the peripheral aspect of
the lateral malleolus at the level of soft tissue swelling (series
4, images 13-14; series 5, image 23) there is preservation of the
normal fatty T1 marrow signal. No cortical destruction. There is
mild reactive marrow signal changes within a bipartite medial hallux
sesamoid and the apposing dorsal first metatarsal head (series 7,
image 3) which could reflect sesamoiditis. Remaining marrow signal
of the ankle and visualized foot is preserved. No fracture.

Other: Circumferential soft tissue swelling and edema, most
pronounced overlying the lateral malleolus of the ankle. No
well-defined or rim enhancing fluid collection. No deep soft tissue
ulceration.
IMPRESSION: IMPRESSION
1. Cellulitis of the left ankle, most pronounced overlying the
lateral malleolus. No well-defined fluid collection or abscess.
2. Very mild marrow edema within the peripheral most portion of the
lateral malleolus underlying the area of cellulitis, likely
reactive. Normal T1 marrow signal without overlying cortical
abnormality to suggest acute osteomyelitis at this time.
3. Small nonspecific subtalar joint effusion, similar in size to
prior study. No associated subchondral marrow signal changes at the
subtalar joint to suggest septic arthritis.
4. Findings suggestive of sesamoiditis of the medial hallux
sesamoid.

## 2021-04-25 DIAGNOSIS — H524 Presbyopia: Secondary | ICD-10-CM | POA: Diagnosis not present

## 2021-05-03 DIAGNOSIS — M792 Neuralgia and neuritis, unspecified: Secondary | ICD-10-CM | POA: Diagnosis not present

## 2021-05-21 DIAGNOSIS — R229 Localized swelling, mass and lump, unspecified: Secondary | ICD-10-CM | POA: Diagnosis not present

## 2021-05-27 DIAGNOSIS — D2339 Other benign neoplasm of skin of other parts of face: Secondary | ICD-10-CM | POA: Diagnosis not present

## 2021-05-27 DIAGNOSIS — L638 Other alopecia areata: Secondary | ICD-10-CM | POA: Diagnosis not present

## 2021-05-27 DIAGNOSIS — R531 Weakness: Secondary | ICD-10-CM | POA: Diagnosis not present

## 2021-06-06 DIAGNOSIS — D485 Neoplasm of uncertain behavior of skin: Secondary | ICD-10-CM | POA: Diagnosis not present

## 2021-06-06 DIAGNOSIS — L905 Scar conditions and fibrosis of skin: Secondary | ICD-10-CM | POA: Diagnosis not present

## 2021-06-20 DIAGNOSIS — M316 Other giant cell arteritis: Secondary | ICD-10-CM | POA: Diagnosis not present

## 2021-06-26 DIAGNOSIS — H35371 Puckering of macula, right eye: Secondary | ICD-10-CM | POA: Diagnosis not present

## 2021-06-26 DIAGNOSIS — H35343 Macular cyst, hole, or pseudohole, bilateral: Secondary | ICD-10-CM | POA: Diagnosis not present

## 2021-06-26 DIAGNOSIS — H35373 Puckering of macula, bilateral: Secondary | ICD-10-CM | POA: Diagnosis not present

## 2021-06-26 DIAGNOSIS — H43813 Vitreous degeneration, bilateral: Secondary | ICD-10-CM | POA: Diagnosis not present

## 2021-06-26 DIAGNOSIS — M316 Other giant cell arteritis: Secondary | ICD-10-CM | POA: Diagnosis not present

## 2021-06-26 DIAGNOSIS — Z961 Presence of intraocular lens: Secondary | ICD-10-CM | POA: Diagnosis not present

## 2021-06-26 DIAGNOSIS — Z79899 Other long term (current) drug therapy: Secondary | ICD-10-CM | POA: Diagnosis not present

## 2021-07-05 DIAGNOSIS — M8580 Other specified disorders of bone density and structure, unspecified site: Secondary | ICD-10-CM | POA: Diagnosis not present

## 2021-07-05 DIAGNOSIS — Z7952 Long term (current) use of systemic steroids: Secondary | ICD-10-CM | POA: Diagnosis not present

## 2021-07-05 DIAGNOSIS — M316 Other giant cell arteritis: Secondary | ICD-10-CM | POA: Diagnosis not present

## 2021-07-05 DIAGNOSIS — Z79899 Other long term (current) drug therapy: Secondary | ICD-10-CM | POA: Diagnosis not present

## 2021-07-09 ENCOUNTER — Ambulatory Visit: Payer: Medicare HMO | Admitting: Psychiatry

## 2021-08-02 DIAGNOSIS — Z8739 Personal history of other diseases of the musculoskeletal system and connective tissue: Secondary | ICD-10-CM | POA: Diagnosis not present

## 2021-08-02 DIAGNOSIS — M316 Other giant cell arteritis: Secondary | ICD-10-CM | POA: Diagnosis not present

## 2021-08-02 DIAGNOSIS — M8580 Other specified disorders of bone density and structure, unspecified site: Secondary | ICD-10-CM | POA: Diagnosis not present

## 2021-08-02 DIAGNOSIS — Z7952 Long term (current) use of systemic steroids: Secondary | ICD-10-CM | POA: Diagnosis not present

## 2021-08-02 DIAGNOSIS — Z79899 Other long term (current) drug therapy: Secondary | ICD-10-CM | POA: Diagnosis not present

## 2021-08-22 DIAGNOSIS — L570 Actinic keratosis: Secondary | ICD-10-CM | POA: Diagnosis not present

## 2021-08-22 DIAGNOSIS — L638 Other alopecia areata: Secondary | ICD-10-CM | POA: Diagnosis not present

## 2021-09-13 DIAGNOSIS — Z888 Allergy status to other drugs, medicaments and biological substances status: Secondary | ICD-10-CM | POA: Diagnosis not present

## 2021-09-13 DIAGNOSIS — M316 Other giant cell arteritis: Secondary | ICD-10-CM | POA: Diagnosis not present

## 2021-09-13 DIAGNOSIS — Z881 Allergy status to other antibiotic agents status: Secondary | ICD-10-CM | POA: Diagnosis not present

## 2021-09-13 DIAGNOSIS — Z8739 Personal history of other diseases of the musculoskeletal system and connective tissue: Secondary | ICD-10-CM | POA: Diagnosis not present

## 2021-09-13 DIAGNOSIS — Z882 Allergy status to sulfonamides status: Secondary | ICD-10-CM | POA: Diagnosis not present

## 2021-09-13 DIAGNOSIS — Z7983 Long term (current) use of bisphosphonates: Secondary | ICD-10-CM | POA: Diagnosis not present

## 2021-09-13 DIAGNOSIS — Z79899 Other long term (current) drug therapy: Secondary | ICD-10-CM | POA: Diagnosis not present

## 2021-09-13 DIAGNOSIS — Z7952 Long term (current) use of systemic steroids: Secondary | ICD-10-CM | POA: Diagnosis not present

## 2021-09-27 DIAGNOSIS — M316 Other giant cell arteritis: Secondary | ICD-10-CM | POA: Diagnosis not present

## 2021-09-27 DIAGNOSIS — Z79899 Other long term (current) drug therapy: Secondary | ICD-10-CM | POA: Diagnosis not present

## 2021-09-30 DIAGNOSIS — S90452A Superficial foreign body, left great toe, initial encounter: Secondary | ICD-10-CM | POA: Diagnosis not present

## 2021-10-11 DIAGNOSIS — Z7952 Long term (current) use of systemic steroids: Secondary | ICD-10-CM | POA: Diagnosis not present

## 2021-10-11 DIAGNOSIS — M316 Other giant cell arteritis: Secondary | ICD-10-CM | POA: Diagnosis not present

## 2021-10-11 DIAGNOSIS — Z882 Allergy status to sulfonamides status: Secondary | ICD-10-CM | POA: Diagnosis not present

## 2021-10-11 DIAGNOSIS — H43813 Vitreous degeneration, bilateral: Secondary | ICD-10-CM | POA: Diagnosis not present

## 2021-10-11 DIAGNOSIS — Z961 Presence of intraocular lens: Secondary | ICD-10-CM | POA: Diagnosis not present

## 2021-10-11 DIAGNOSIS — Z881 Allergy status to other antibiotic agents status: Secondary | ICD-10-CM | POA: Diagnosis not present

## 2021-10-11 DIAGNOSIS — H35343 Macular cyst, hole, or pseudohole, bilateral: Secondary | ICD-10-CM | POA: Diagnosis not present

## 2021-10-11 DIAGNOSIS — D1801 Hemangioma of skin and subcutaneous tissue: Secondary | ICD-10-CM | POA: Diagnosis not present

## 2021-10-11 DIAGNOSIS — H35373 Puckering of macula, bilateral: Secondary | ICD-10-CM | POA: Diagnosis not present

## 2021-10-30 DIAGNOSIS — M542 Cervicalgia: Secondary | ICD-10-CM | POA: Diagnosis not present

## 2021-10-30 DIAGNOSIS — R55 Syncope and collapse: Secondary | ICD-10-CM | POA: Diagnosis not present

## 2021-10-30 DIAGNOSIS — S0083XA Contusion of other part of head, initial encounter: Secondary | ICD-10-CM | POA: Diagnosis not present

## 2021-10-30 DIAGNOSIS — W010XXA Fall on same level from slipping, tripping and stumbling without subsequent striking against object, initial encounter: Secondary | ICD-10-CM | POA: Diagnosis not present

## 2021-10-30 DIAGNOSIS — Y998 Other external cause status: Secondary | ICD-10-CM | POA: Diagnosis not present

## 2021-10-30 DIAGNOSIS — I10 Essential (primary) hypertension: Secondary | ICD-10-CM | POA: Diagnosis not present

## 2021-10-30 DIAGNOSIS — S0081XA Abrasion of other part of head, initial encounter: Secondary | ICD-10-CM | POA: Diagnosis not present

## 2021-10-30 DIAGNOSIS — S0990XA Unspecified injury of head, initial encounter: Secondary | ICD-10-CM | POA: Diagnosis not present

## 2021-10-30 DIAGNOSIS — Z043 Encounter for examination and observation following other accident: Secondary | ICD-10-CM | POA: Diagnosis not present

## 2021-10-30 DIAGNOSIS — W19XXXA Unspecified fall, initial encounter: Secondary | ICD-10-CM | POA: Diagnosis not present

## 2021-10-30 DIAGNOSIS — S199XXA Unspecified injury of neck, initial encounter: Secondary | ICD-10-CM | POA: Diagnosis not present

## 2021-10-30 DIAGNOSIS — R42 Dizziness and giddiness: Secondary | ICD-10-CM | POA: Diagnosis not present

## 2021-10-30 DIAGNOSIS — R8290 Unspecified abnormal findings in urine: Secondary | ICD-10-CM | POA: Diagnosis not present

## 2021-11-08 DIAGNOSIS — M8580 Other specified disorders of bone density and structure, unspecified site: Secondary | ICD-10-CM | POA: Diagnosis not present

## 2021-11-08 DIAGNOSIS — M542 Cervicalgia: Secondary | ICD-10-CM | POA: Diagnosis not present

## 2021-11-08 DIAGNOSIS — Z7952 Long term (current) use of systemic steroids: Secondary | ICD-10-CM | POA: Diagnosis not present

## 2021-11-08 DIAGNOSIS — M316 Other giant cell arteritis: Secondary | ICD-10-CM | POA: Diagnosis not present

## 2021-11-08 DIAGNOSIS — Z79899 Other long term (current) drug therapy: Secondary | ICD-10-CM | POA: Diagnosis not present

## 2021-11-11 DIAGNOSIS — M542 Cervicalgia: Secondary | ICD-10-CM | POA: Diagnosis not present

## 2021-11-11 DIAGNOSIS — R55 Syncope and collapse: Secondary | ICD-10-CM | POA: Diagnosis not present

## 2021-11-11 DIAGNOSIS — S0003XA Contusion of scalp, initial encounter: Secondary | ICD-10-CM | POA: Diagnosis not present

## 2021-11-12 DIAGNOSIS — H35373 Puckering of macula, bilateral: Secondary | ICD-10-CM | POA: Diagnosis not present

## 2021-11-12 DIAGNOSIS — M316 Other giant cell arteritis: Secondary | ICD-10-CM | POA: Diagnosis not present

## 2021-11-12 DIAGNOSIS — Z961 Presence of intraocular lens: Secondary | ICD-10-CM | POA: Diagnosis not present

## 2021-11-12 DIAGNOSIS — H53413 Scotoma involving central area, bilateral: Secondary | ICD-10-CM | POA: Diagnosis not present

## 2021-11-12 DIAGNOSIS — H35343 Macular cyst, hole, or pseudohole, bilateral: Secondary | ICD-10-CM | POA: Diagnosis not present

## 2021-11-12 DIAGNOSIS — H43813 Vitreous degeneration, bilateral: Secondary | ICD-10-CM | POA: Diagnosis not present

## 2021-11-19 DIAGNOSIS — R519 Headache, unspecified: Secondary | ICD-10-CM | POA: Diagnosis not present

## 2021-11-19 DIAGNOSIS — R350 Frequency of micturition: Secondary | ICD-10-CM | POA: Diagnosis not present

## 2021-12-05 DIAGNOSIS — M542 Cervicalgia: Secondary | ICD-10-CM | POA: Diagnosis not present

## 2021-12-05 DIAGNOSIS — Y998 Other external cause status: Secondary | ICD-10-CM | POA: Diagnosis not present

## 2021-12-05 DIAGNOSIS — W010XXA Fall on same level from slipping, tripping and stumbling without subsequent striking against object, initial encounter: Secondary | ICD-10-CM | POA: Diagnosis not present

## 2021-12-05 DIAGNOSIS — I672 Cerebral atherosclerosis: Secondary | ICD-10-CM | POA: Diagnosis not present

## 2021-12-05 DIAGNOSIS — R519 Headache, unspecified: Secondary | ICD-10-CM | POA: Diagnosis not present

## 2021-12-05 DIAGNOSIS — S161XXA Strain of muscle, fascia and tendon at neck level, initial encounter: Secondary | ICD-10-CM | POA: Diagnosis not present

## 2021-12-09 DIAGNOSIS — Z113 Encounter for screening for infections with a predominantly sexual mode of transmission: Secondary | ICD-10-CM | POA: Diagnosis not present

## 2021-12-09 DIAGNOSIS — Z1159 Encounter for screening for other viral diseases: Secondary | ICD-10-CM | POA: Diagnosis not present

## 2021-12-09 DIAGNOSIS — M316 Other giant cell arteritis: Secondary | ICD-10-CM | POA: Diagnosis not present

## 2021-12-12 DIAGNOSIS — Z961 Presence of intraocular lens: Secondary | ICD-10-CM | POA: Diagnosis not present

## 2021-12-12 DIAGNOSIS — H35343 Macular cyst, hole, or pseudohole, bilateral: Secondary | ICD-10-CM | POA: Diagnosis not present

## 2021-12-12 DIAGNOSIS — M316 Other giant cell arteritis: Secondary | ICD-10-CM | POA: Diagnosis not present

## 2021-12-12 DIAGNOSIS — H53453 Other localized visual field defect, bilateral: Secondary | ICD-10-CM | POA: Diagnosis not present

## 2021-12-12 DIAGNOSIS — H35371 Puckering of macula, right eye: Secondary | ICD-10-CM | POA: Diagnosis not present

## 2021-12-12 DIAGNOSIS — H43813 Vitreous degeneration, bilateral: Secondary | ICD-10-CM | POA: Diagnosis not present

## 2021-12-19 ENCOUNTER — Encounter: Payer: Self-pay | Admitting: Infectious Diseases

## 2022-02-26 DIAGNOSIS — K219 Gastro-esophageal reflux disease without esophagitis: Secondary | ICD-10-CM | POA: Diagnosis not present

## 2022-02-26 DIAGNOSIS — Z888 Allergy status to other drugs, medicaments and biological substances status: Secondary | ICD-10-CM | POA: Diagnosis not present

## 2022-02-26 DIAGNOSIS — Z881 Allergy status to other antibiotic agents status: Secondary | ICD-10-CM | POA: Diagnosis not present

## 2022-02-26 DIAGNOSIS — Z882 Allergy status to sulfonamides status: Secondary | ICD-10-CM | POA: Diagnosis not present

## 2022-02-26 DIAGNOSIS — J342 Deviated nasal septum: Secondary | ICD-10-CM | POA: Diagnosis not present

## 2022-02-26 DIAGNOSIS — J343 Hypertrophy of nasal turbinates: Secondary | ICD-10-CM | POA: Diagnosis not present

## 2022-02-26 DIAGNOSIS — R0981 Nasal congestion: Secondary | ICD-10-CM | POA: Diagnosis not present

## 2022-02-26 DIAGNOSIS — J3489 Other specified disorders of nose and nasal sinuses: Secondary | ICD-10-CM | POA: Diagnosis not present

## 2022-03-10 DIAGNOSIS — S61224A Laceration with foreign body of right ring finger without damage to nail, initial encounter: Secondary | ICD-10-CM | POA: Diagnosis not present

## 2022-03-10 DIAGNOSIS — S61212A Laceration without foreign body of right middle finger without damage to nail, initial encounter: Secondary | ICD-10-CM | POA: Diagnosis not present

## 2022-03-10 DIAGNOSIS — S61216A Laceration without foreign body of right little finger without damage to nail, initial encounter: Secondary | ICD-10-CM | POA: Diagnosis not present

## 2022-03-13 DIAGNOSIS — Z79899 Other long term (current) drug therapy: Secondary | ICD-10-CM | POA: Diagnosis not present

## 2022-03-13 DIAGNOSIS — M316 Other giant cell arteritis: Secondary | ICD-10-CM | POA: Diagnosis not present

## 2022-03-14 DIAGNOSIS — Z9181 History of falling: Secondary | ICD-10-CM | POA: Diagnosis not present

## 2022-03-14 DIAGNOSIS — Z79620 Long term (current) use of immunosuppressive biologic: Secondary | ICD-10-CM | POA: Diagnosis not present

## 2022-03-14 DIAGNOSIS — M316 Other giant cell arteritis: Secondary | ICD-10-CM | POA: Diagnosis not present

## 2022-03-14 DIAGNOSIS — M8580 Other specified disorders of bone density and structure, unspecified site: Secondary | ICD-10-CM | POA: Diagnosis not present

## 2022-03-14 DIAGNOSIS — Z5181 Encounter for therapeutic drug level monitoring: Secondary | ICD-10-CM | POA: Diagnosis not present

## 2022-03-14 DIAGNOSIS — Z79899 Other long term (current) drug therapy: Secondary | ICD-10-CM | POA: Diagnosis not present

## 2022-03-15 DIAGNOSIS — Z5189 Encounter for other specified aftercare: Secondary | ICD-10-CM | POA: Diagnosis not present

## 2022-03-25 DIAGNOSIS — Z4802 Encounter for removal of sutures: Secondary | ICD-10-CM | POA: Diagnosis not present

## 2022-04-01 DIAGNOSIS — S61411A Laceration without foreign body of right hand, initial encounter: Secondary | ICD-10-CM | POA: Diagnosis not present

## 2022-04-17 DIAGNOSIS — H35373 Puckering of macula, bilateral: Secondary | ICD-10-CM | POA: Diagnosis not present

## 2022-04-17 DIAGNOSIS — H53413 Scotoma involving central area, bilateral: Secondary | ICD-10-CM | POA: Diagnosis not present

## 2022-04-17 DIAGNOSIS — H53453 Other localized visual field defect, bilateral: Secondary | ICD-10-CM | POA: Diagnosis not present

## 2022-04-17 DIAGNOSIS — H43813 Vitreous degeneration, bilateral: Secondary | ICD-10-CM | POA: Diagnosis not present

## 2022-04-17 DIAGNOSIS — H35343 Macular cyst, hole, or pseudohole, bilateral: Secondary | ICD-10-CM | POA: Diagnosis not present

## 2022-04-17 DIAGNOSIS — M316 Other giant cell arteritis: Secondary | ICD-10-CM | POA: Diagnosis not present

## 2022-04-17 DIAGNOSIS — Z961 Presence of intraocular lens: Secondary | ICD-10-CM | POA: Diagnosis not present

## 2022-04-21 DIAGNOSIS — R202 Paresthesia of skin: Secondary | ICD-10-CM | POA: Diagnosis not present

## 2022-04-21 DIAGNOSIS — M79641 Pain in right hand: Secondary | ICD-10-CM | POA: Diagnosis not present

## 2022-04-21 DIAGNOSIS — M25641 Stiffness of right hand, not elsewhere classified: Secondary | ICD-10-CM | POA: Diagnosis not present

## 2022-04-21 DIAGNOSIS — M79644 Pain in right finger(s): Secondary | ICD-10-CM | POA: Diagnosis not present

## 2022-04-21 DIAGNOSIS — M62541 Muscle wasting and atrophy, not elsewhere classified, right hand: Secondary | ICD-10-CM | POA: Diagnosis not present

## 2022-04-24 DIAGNOSIS — M79644 Pain in right finger(s): Secondary | ICD-10-CM | POA: Diagnosis not present

## 2022-04-24 DIAGNOSIS — M62541 Muscle wasting and atrophy, not elsewhere classified, right hand: Secondary | ICD-10-CM | POA: Diagnosis not present

## 2022-04-24 DIAGNOSIS — M79641 Pain in right hand: Secondary | ICD-10-CM | POA: Diagnosis not present

## 2022-04-24 DIAGNOSIS — M25641 Stiffness of right hand, not elsewhere classified: Secondary | ICD-10-CM | POA: Diagnosis not present

## 2022-04-24 DIAGNOSIS — R202 Paresthesia of skin: Secondary | ICD-10-CM | POA: Diagnosis not present

## 2022-04-25 DIAGNOSIS — Z7952 Long term (current) use of systemic steroids: Secondary | ICD-10-CM | POA: Diagnosis not present

## 2022-04-25 DIAGNOSIS — M542 Cervicalgia: Secondary | ICD-10-CM | POA: Diagnosis not present

## 2022-04-25 DIAGNOSIS — M8580 Other specified disorders of bone density and structure, unspecified site: Secondary | ICD-10-CM | POA: Diagnosis not present

## 2022-04-25 DIAGNOSIS — M316 Other giant cell arteritis: Secondary | ICD-10-CM | POA: Diagnosis not present

## 2022-04-25 DIAGNOSIS — Z79620 Long term (current) use of immunosuppressive biologic: Secondary | ICD-10-CM | POA: Diagnosis not present

## 2022-04-25 DIAGNOSIS — Z79899 Other long term (current) drug therapy: Secondary | ICD-10-CM | POA: Diagnosis not present

## 2022-04-25 DIAGNOSIS — Z7983 Long term (current) use of bisphosphonates: Secondary | ICD-10-CM | POA: Diagnosis not present

## 2022-04-29 DIAGNOSIS — M79644 Pain in right finger(s): Secondary | ICD-10-CM | POA: Diagnosis not present

## 2022-04-29 DIAGNOSIS — R202 Paresthesia of skin: Secondary | ICD-10-CM | POA: Diagnosis not present

## 2022-04-29 DIAGNOSIS — M79641 Pain in right hand: Secondary | ICD-10-CM | POA: Diagnosis not present

## 2022-04-29 DIAGNOSIS — M62541 Muscle wasting and atrophy, not elsewhere classified, right hand: Secondary | ICD-10-CM | POA: Diagnosis not present

## 2022-04-29 DIAGNOSIS — M25641 Stiffness of right hand, not elsewhere classified: Secondary | ICD-10-CM | POA: Diagnosis not present

## 2022-05-01 DIAGNOSIS — H35373 Puckering of macula, bilateral: Secondary | ICD-10-CM | POA: Diagnosis not present

## 2022-05-01 DIAGNOSIS — H43813 Vitreous degeneration, bilateral: Secondary | ICD-10-CM | POA: Diagnosis not present

## 2022-05-01 DIAGNOSIS — H53413 Scotoma involving central area, bilateral: Secondary | ICD-10-CM | POA: Diagnosis not present

## 2022-05-01 DIAGNOSIS — Z961 Presence of intraocular lens: Secondary | ICD-10-CM | POA: Diagnosis not present

## 2022-05-06 DIAGNOSIS — M79644 Pain in right finger(s): Secondary | ICD-10-CM | POA: Diagnosis not present

## 2022-05-06 DIAGNOSIS — R202 Paresthesia of skin: Secondary | ICD-10-CM | POA: Diagnosis not present

## 2022-05-06 DIAGNOSIS — M62541 Muscle wasting and atrophy, not elsewhere classified, right hand: Secondary | ICD-10-CM | POA: Diagnosis not present

## 2022-05-06 DIAGNOSIS — M79641 Pain in right hand: Secondary | ICD-10-CM | POA: Diagnosis not present

## 2022-05-06 DIAGNOSIS — M25641 Stiffness of right hand, not elsewhere classified: Secondary | ICD-10-CM | POA: Diagnosis not present

## 2022-05-08 DIAGNOSIS — H53413 Scotoma involving central area, bilateral: Secondary | ICD-10-CM | POA: Diagnosis not present

## 2022-05-08 DIAGNOSIS — Z136 Encounter for screening for cardiovascular disorders: Secondary | ICD-10-CM | POA: Diagnosis not present

## 2022-05-09 DIAGNOSIS — M79644 Pain in right finger(s): Secondary | ICD-10-CM | POA: Diagnosis not present

## 2022-05-09 DIAGNOSIS — M79641 Pain in right hand: Secondary | ICD-10-CM | POA: Diagnosis not present

## 2022-05-09 DIAGNOSIS — M25641 Stiffness of right hand, not elsewhere classified: Secondary | ICD-10-CM | POA: Diagnosis not present

## 2022-05-09 DIAGNOSIS — M62541 Muscle wasting and atrophy, not elsewhere classified, right hand: Secondary | ICD-10-CM | POA: Diagnosis not present

## 2022-05-09 DIAGNOSIS — R202 Paresthesia of skin: Secondary | ICD-10-CM | POA: Diagnosis not present

## 2022-05-13 DIAGNOSIS — M79644 Pain in right finger(s): Secondary | ICD-10-CM | POA: Diagnosis not present

## 2022-05-13 DIAGNOSIS — M25641 Stiffness of right hand, not elsewhere classified: Secondary | ICD-10-CM | POA: Diagnosis not present

## 2022-05-13 DIAGNOSIS — R202 Paresthesia of skin: Secondary | ICD-10-CM | POA: Diagnosis not present

## 2022-05-13 DIAGNOSIS — M79641 Pain in right hand: Secondary | ICD-10-CM | POA: Diagnosis not present

## 2022-05-13 DIAGNOSIS — M62541 Muscle wasting and atrophy, not elsewhere classified, right hand: Secondary | ICD-10-CM | POA: Diagnosis not present

## 2022-05-16 DIAGNOSIS — M62541 Muscle wasting and atrophy, not elsewhere classified, right hand: Secondary | ICD-10-CM | POA: Diagnosis not present

## 2022-05-16 DIAGNOSIS — M79641 Pain in right hand: Secondary | ICD-10-CM | POA: Diagnosis not present

## 2022-05-16 DIAGNOSIS — R202 Paresthesia of skin: Secondary | ICD-10-CM | POA: Diagnosis not present

## 2022-05-16 DIAGNOSIS — M79644 Pain in right finger(s): Secondary | ICD-10-CM | POA: Diagnosis not present

## 2022-05-16 DIAGNOSIS — M25641 Stiffness of right hand, not elsewhere classified: Secondary | ICD-10-CM | POA: Diagnosis not present

## 2022-06-07 DIAGNOSIS — H53413 Scotoma involving central area, bilateral: Secondary | ICD-10-CM | POA: Diagnosis not present

## 2022-07-07 DIAGNOSIS — L578 Other skin changes due to chronic exposure to nonionizing radiation: Secondary | ICD-10-CM | POA: Diagnosis not present

## 2022-07-07 DIAGNOSIS — L820 Inflamed seborrheic keratosis: Secondary | ICD-10-CM | POA: Diagnosis not present

## 2022-07-07 DIAGNOSIS — L821 Other seborrheic keratosis: Secondary | ICD-10-CM | POA: Diagnosis not present

## 2022-07-25 DIAGNOSIS — M316 Other giant cell arteritis: Secondary | ICD-10-CM | POA: Diagnosis not present

## 2022-07-25 DIAGNOSIS — G9389 Other specified disorders of brain: Secondary | ICD-10-CM | POA: Diagnosis not present

## 2022-07-25 DIAGNOSIS — I671 Cerebral aneurysm, nonruptured: Secondary | ICD-10-CM | POA: Diagnosis not present

## 2022-07-29 DIAGNOSIS — Z79899 Other long term (current) drug therapy: Secondary | ICD-10-CM | POA: Diagnosis not present

## 2022-07-29 DIAGNOSIS — M316 Other giant cell arteritis: Secondary | ICD-10-CM | POA: Diagnosis not present

## 2022-08-01 DIAGNOSIS — Z79620 Long term (current) use of immunosuppressive biologic: Secondary | ICD-10-CM | POA: Diagnosis not present

## 2022-08-01 DIAGNOSIS — Z79899 Other long term (current) drug therapy: Secondary | ICD-10-CM | POA: Diagnosis not present

## 2022-08-01 DIAGNOSIS — Z7952 Long term (current) use of systemic steroids: Secondary | ICD-10-CM | POA: Diagnosis not present

## 2022-08-01 DIAGNOSIS — M542 Cervicalgia: Secondary | ICD-10-CM | POA: Diagnosis not present

## 2022-08-01 DIAGNOSIS — M316 Other giant cell arteritis: Secondary | ICD-10-CM | POA: Diagnosis not present

## 2022-08-01 DIAGNOSIS — M8580 Other specified disorders of bone density and structure, unspecified site: Secondary | ICD-10-CM | POA: Diagnosis not present

## 2022-08-01 DIAGNOSIS — Z7983 Long term (current) use of bisphosphonates: Secondary | ICD-10-CM | POA: Diagnosis not present

## 2022-08-05 DIAGNOSIS — L638 Other alopecia areata: Secondary | ICD-10-CM | POA: Diagnosis not present

## 2022-08-05 DIAGNOSIS — L820 Inflamed seborrheic keratosis: Secondary | ICD-10-CM | POA: Diagnosis not present

## 2022-08-06 DIAGNOSIS — H53413 Scotoma involving central area, bilateral: Secondary | ICD-10-CM | POA: Diagnosis not present

## 2022-08-06 DIAGNOSIS — Z961 Presence of intraocular lens: Secondary | ICD-10-CM | POA: Diagnosis not present

## 2022-08-06 DIAGNOSIS — M316 Other giant cell arteritis: Secondary | ICD-10-CM | POA: Diagnosis not present

## 2022-08-06 DIAGNOSIS — H26492 Other secondary cataract, left eye: Secondary | ICD-10-CM | POA: Diagnosis not present

## 2022-08-06 DIAGNOSIS — H35343 Macular cyst, hole, or pseudohole, bilateral: Secondary | ICD-10-CM | POA: Diagnosis not present

## 2022-08-06 DIAGNOSIS — H16003 Unspecified corneal ulcer, bilateral: Secondary | ICD-10-CM | POA: Diagnosis not present

## 2022-08-06 DIAGNOSIS — H35373 Puckering of macula, bilateral: Secondary | ICD-10-CM | POA: Diagnosis not present

## 2022-08-06 DIAGNOSIS — D4981 Neoplasm of unspecified behavior of retina and choroid: Secondary | ICD-10-CM | POA: Diagnosis not present

## 2022-08-06 DIAGNOSIS — H43813 Vitreous degeneration, bilateral: Secondary | ICD-10-CM | POA: Diagnosis not present

## 2022-08-25 DIAGNOSIS — M316 Other giant cell arteritis: Secondary | ICD-10-CM | POA: Diagnosis not present

## 2022-08-25 DIAGNOSIS — M8589 Other specified disorders of bone density and structure, multiple sites: Secondary | ICD-10-CM | POA: Diagnosis not present

## 2022-08-25 DIAGNOSIS — Z6829 Body mass index (BMI) 29.0-29.9, adult: Secondary | ICD-10-CM | POA: Diagnosis not present

## 2022-08-25 DIAGNOSIS — E7800 Pure hypercholesterolemia, unspecified: Secondary | ICD-10-CM | POA: Diagnosis not present

## 2022-08-25 DIAGNOSIS — Z0000 Encounter for general adult medical examination without abnormal findings: Secondary | ICD-10-CM | POA: Diagnosis not present

## 2022-08-25 DIAGNOSIS — Z1389 Encounter for screening for other disorder: Secondary | ICD-10-CM | POA: Diagnosis not present

## 2022-08-25 DIAGNOSIS — N39498 Other specified urinary incontinence: Secondary | ICD-10-CM | POA: Diagnosis not present

## 2022-08-25 DIAGNOSIS — E559 Vitamin D deficiency, unspecified: Secondary | ICD-10-CM | POA: Diagnosis not present

## 2022-08-25 DIAGNOSIS — Z79899 Other long term (current) drug therapy: Secondary | ICD-10-CM | POA: Diagnosis not present

## 2022-09-25 DIAGNOSIS — M8589 Other specified disorders of bone density and structure, multiple sites: Secondary | ICD-10-CM | POA: Diagnosis not present

## 2022-09-25 DIAGNOSIS — M85851 Other specified disorders of bone density and structure, right thigh: Secondary | ICD-10-CM | POA: Diagnosis not present

## 2022-12-22 DIAGNOSIS — M316 Other giant cell arteritis: Secondary | ICD-10-CM | POA: Diagnosis not present

## 2022-12-22 DIAGNOSIS — Z79899 Other long term (current) drug therapy: Secondary | ICD-10-CM | POA: Diagnosis not present

## 2022-12-24 DIAGNOSIS — M316 Other giant cell arteritis: Secondary | ICD-10-CM | POA: Diagnosis not present

## 2022-12-24 DIAGNOSIS — I671 Cerebral aneurysm, nonruptured: Secondary | ICD-10-CM | POA: Diagnosis not present

## 2022-12-24 DIAGNOSIS — M8589 Other specified disorders of bone density and structure, multiple sites: Secondary | ICD-10-CM | POA: Diagnosis not present

## 2022-12-24 DIAGNOSIS — Z79899 Other long term (current) drug therapy: Secondary | ICD-10-CM | POA: Diagnosis not present

## 2023-01-07 DIAGNOSIS — J111 Influenza due to unidentified influenza virus with other respiratory manifestations: Secondary | ICD-10-CM | POA: Diagnosis not present

## 2023-01-28 DIAGNOSIS — Z0000 Encounter for general adult medical examination without abnormal findings: Secondary | ICD-10-CM | POA: Diagnosis not present

## 2023-01-28 DIAGNOSIS — Z13220 Encounter for screening for lipoid disorders: Secondary | ICD-10-CM | POA: Diagnosis not present

## 2023-01-30 DIAGNOSIS — M8580 Other specified disorders of bone density and structure, unspecified site: Secondary | ICD-10-CM | POA: Diagnosis not present

## 2023-01-30 DIAGNOSIS — E7800 Pure hypercholesterolemia, unspecified: Secondary | ICD-10-CM | POA: Diagnosis not present

## 2023-01-30 DIAGNOSIS — D508 Other iron deficiency anemias: Secondary | ICD-10-CM | POA: Diagnosis not present

## 2023-01-30 DIAGNOSIS — R7303 Prediabetes: Secondary | ICD-10-CM | POA: Diagnosis not present

## 2023-02-09 DIAGNOSIS — B353 Tinea pedis: Secondary | ICD-10-CM | POA: Diagnosis not present

## 2023-04-17 DIAGNOSIS — Z79899 Other long term (current) drug therapy: Secondary | ICD-10-CM | POA: Diagnosis not present

## 2023-04-17 DIAGNOSIS — M316 Other giant cell arteritis: Secondary | ICD-10-CM | POA: Diagnosis not present

## 2023-04-30 DIAGNOSIS — H35373 Puckering of macula, bilateral: Secondary | ICD-10-CM | POA: Diagnosis not present

## 2023-04-30 DIAGNOSIS — M316 Other giant cell arteritis: Secondary | ICD-10-CM | POA: Diagnosis not present

## 2023-04-30 DIAGNOSIS — H35343 Macular cyst, hole, or pseudohole, bilateral: Secondary | ICD-10-CM | POA: Diagnosis not present

## 2023-04-30 DIAGNOSIS — H527 Unspecified disorder of refraction: Secondary | ICD-10-CM | POA: Diagnosis not present
